# Patient Record
Sex: Female | Born: 1965
Health system: Southern US, Community
[De-identification: ages and names within clinical notes are randomized; demographics above are authoritative.]

## PROBLEM LIST (undated history)

## (undated) DIAGNOSIS — E785 Hyperlipidemia, unspecified: Secondary | ICD-10-CM

## (undated) DIAGNOSIS — F319 Bipolar disorder, unspecified: Secondary | ICD-10-CM

## (undated) DIAGNOSIS — E119 Type 2 diabetes mellitus without complications: Secondary | ICD-10-CM

## (undated) DIAGNOSIS — I10 Essential (primary) hypertension: Secondary | ICD-10-CM

## (undated) HISTORY — PX: CHOLECYSTECTOMY: SHX55

## (undated) HISTORY — PX: APPENDECTOMY: SHX54

---

## 2013-08-23 ENCOUNTER — Encounter (HOSPITAL_BASED_OUTPATIENT_CLINIC_OR_DEPARTMENT_OTHER): Payer: Self-pay | Admitting: Emergency Medicine

## 2013-08-23 ENCOUNTER — Emergency Department (HOSPITAL_BASED_OUTPATIENT_CLINIC_OR_DEPARTMENT_OTHER): Payer: Medicaid Other

## 2013-08-23 ENCOUNTER — Emergency Department (HOSPITAL_BASED_OUTPATIENT_CLINIC_OR_DEPARTMENT_OTHER)
Admission: EM | Admit: 2013-08-23 | Discharge: 2013-08-23 | Disposition: A | Discharge status: Home / Self Care | Payer: Medicaid Other | Attending: Emergency Medicine | Admitting: Emergency Medicine

## 2013-08-23 DIAGNOSIS — F319 Bipolar disorder, unspecified: Secondary | ICD-10-CM | POA: Insufficient documentation

## 2013-08-23 DIAGNOSIS — Y929 Unspecified place or not applicable: Secondary | ICD-10-CM | POA: Insufficient documentation

## 2013-08-23 DIAGNOSIS — Y9389 Activity, other specified: Secondary | ICD-10-CM | POA: Insufficient documentation

## 2013-08-23 DIAGNOSIS — S5001XA Contusion of right elbow, initial encounter: Secondary | ICD-10-CM

## 2013-08-23 DIAGNOSIS — Z794 Long term (current) use of insulin: Secondary | ICD-10-CM | POA: Insufficient documentation

## 2013-08-23 DIAGNOSIS — S5000XA Contusion of unspecified elbow, initial encounter: Secondary | ICD-10-CM | POA: Insufficient documentation

## 2013-08-23 DIAGNOSIS — I10 Essential (primary) hypertension: Secondary | ICD-10-CM | POA: Insufficient documentation

## 2013-08-23 DIAGNOSIS — W2209XA Striking against other stationary object, initial encounter: Secondary | ICD-10-CM | POA: Insufficient documentation

## 2013-08-23 DIAGNOSIS — Z79899 Other long term (current) drug therapy: Secondary | ICD-10-CM | POA: Insufficient documentation

## 2013-08-23 DIAGNOSIS — E119 Type 2 diabetes mellitus without complications: Secondary | ICD-10-CM | POA: Insufficient documentation

## 2013-08-23 HISTORY — DX: Essential (primary) hypertension: I10

## 2013-08-23 HISTORY — DX: Type 2 diabetes mellitus without complications: E11.9

## 2013-08-23 HISTORY — DX: Bipolar disorder, unspecified: F31.9

## 2013-08-23 MED ORDER — IBUPROFEN 800 MG PO TABS: 800.0000 mg | ORAL_TABLET | Freq: Three times a day (TID) | ORAL | Status: DC

## 2013-08-23 NOTE — ED Provider Notes (Signed)
CSN: [id number]     Arrival date & time 08/23/13  1421 History   First MD Initiated Contact with Patient 08/23/13 1539     Chief Complaint  Patient presents with  . Arm Pain   (Consider location/radiation/quality/duration/timing/severity/associated sxs/prior Treatment) Patient is a 48 y.o. female presenting with arm pain. The history is provided by the patient.  Arm Pain This is a new problem. The current episode started yesterday. The problem occurs constantly. The problem has been unchanged. She has tried acetaminophen and oral narcotics for the symptoms. The treatment provided moderate relief.   Megan Werner is a 48 y.o. female who presents to the ED with right elbow pain that stated yesterday after she hit her elbow on the bed frame. She applied ice and took an oxycodone she had from a previous visit. She denies any other injuries. The pain radiates from the elbow to the upper arm.  Past Medical History  Diagnosis Date  . Diabetes mellitus without complication   . Hypertension   . Bipolar 1 disorder    History reviewed. No pertinent past surgical history. No family history on file. History  Substance Use Topics  . Smoking status: Never Smoker   . Smokeless tobacco: Not on file  . Alcohol Use: Not on file   OB History   Grav Para Term Preterm Abortions TAB SAB Ect Mult Living                 Review of Systems Negative except as stated in HPI  Allergies  Review of patient's allergies indicates no known allergies.  Home Medications   Current Outpatient Rx  Name  Route  Sig  Dispense  Refill  . carvedilol (COREG) 12.5 MG tablet   Oral   Take 12.5 mg by mouth 2 (two) times daily with a meal.         . insulin glargine (LANTUS) 100 UNIT/ML injection   Subcutaneous   Inject 35 Units into the skin 2 (two) times daily.         Marland Kitchen losartan (COZAAR) 100 MG tablet   Oral   Take 100 mg by mouth daily.         . metFORMIN (GLUCOPHAGE) 1000 MG tablet   Oral   Take  1,000 mg by mouth 2 (two) times daily with a meal.         . oxyCODONE-acetaminophen (PERCOCET/ROXICET) 5-325 MG per tablet   Oral   Take by mouth every 4 (four) hours as needed for severe pain.         . traZODone (DESYREL) 100 MG tablet   Oral   Take 100 mg by mouth at bedtime.         . Vilazodone HCl (VIIBRYD) 10 MG TABS   Oral   Take by mouth daily.          BP 118/83  Pulse 73  Temp(Src) 97.7 F (36.5 C) (Oral)  Resp 18  Ht 5\' 2"  (1.575 m)  Wt 260 lb (117.935 kg)  BMI 47.54 kg/m2  SpO2 95% Physical Exam  Nursing note and vitals reviewed. Constitutional: She is oriented to person, place, and time. She appears well-developed and well-nourished.  HENT:  Head: Normocephalic.  Eyes: EOM are normal.  Neck: Neck supple.  Cardiovascular: Normal rate.   Pulmonary/Chest: Effort normal.  Musculoskeletal: Normal range of motion.       Right elbow: She exhibits normal range of motion, no swelling, no deformity and no laceration. Tenderness found.  Radial head tenderness noted.       Arms: The pain radiates to the upper and lower arm. Radial pulse strong and equal. Adequate circulation, good touch sensation. Full range of motion without difficulty. Pain with palpation over elbow.  Neurological: She is alert and oriented to person, place, and time. No cranial nerve deficit.  Skin: Skin is warm and dry.  Psychiatric: She has a normal mood and affect. Her behavior is normal.    ED Course  Procedures (including critical care time) Labs Review Labs Reviewed - No data to display Imaging Review Dg Elbow Complete Right  08/23/2013   CLINICAL DATA:  Pain post injury yesterday  EXAM: RIGHT ELBOW - COMPLETE 3+ VIEW  COMPARISON:  None.  FINDINGS: Five views of the right elbow submitted. No acute fracture or subluxation. No radiopaque foreign body. No posterior fat pad sign.  IMPRESSION: Negative.   Electronically Signed   By: Natasha MeadLiviu  Pop M.D.   On: 08/23/2013 15:17   Dg Forearm  Right  08/23/2013   CLINICAL DATA:  Pain post injury yesterday  EXAM: RIGHT FOREARM - 2 VIEW  COMPARISON:  None.  FINDINGS: There is no evidence of fracture or other focal bone lesions. Soft tissues are unremarkable.  IMPRESSION: Negative.   Electronically Signed   By: Natasha MeadLiviu  Pop M.D.   On: 08/23/2013 15:18     MDM  48 y.o. female with contusion to the right elbow. I have reviewed this patient's vital signs, nurses notes, appropriate labs and imaging.  I have discussed findings and plan of care with the patient and she voices understanding.  Will give ibuprofen for pain, she will apply ice and elevate and follow up with her PCP. She will return here as needed.     Janne NapoleonHope M Neese, [place]NP 08/24/13 [phone]

## 2013-08-23 NOTE — Discharge Instructions (Signed)
Elbow Contusion °An elbow contusion is a deep bruise of the elbow. Contusions are the result of an injury that caused bleeding under the skin. The contusion may turn blue, purple, or yellow. Minor injuries will give you a painless contusion, but more severe contusions may stay painful and swollen for a few weeks.  °CAUSES  °An elbow contusion comes from a direct force to that area, such as falling on the elbow. °SYMPTOMS  °· Swelling and redness of the elbow. °· Bruising of the elbow area. °· Tenderness or soreness of the elbow. °DIAGNOSIS  °You will have a physical exam and will be asked about your history. You may need an X-ray of your elbow to look for a broken bone (fracture).  °TREATMENT  °A sling or splint may be needed to support your injury. Resting, elevating, and applying cold compresses to the elbow area are often the best treatments for an elbow contusion. Over-the-counter medicines may also be recommended for pain control. °HOME CARE INSTRUCTIONS  °· Put ice on the injured area. °· Put ice in a plastic bag. °· Place a towel between your skin and the bag. °· Leave the ice on for 15-20 minutes, 03-04 times a day. °· Only take over-the-counter or prescription medicines for pain, discomfort, or fever as directed by your caregiver. °· Rest your injured elbow until the pain and swelling are better. °· Elevate your elbow to reduce swelling. °· Apply a compression wrap as directed by your caregiver. This can help reduce swelling and motion. You may remove the wrap for sleeping, showers, and baths. If your fingers become numb, cold, or blue, take the wrap off and reapply it more loosely. °· Use your elbow only as directed by your caregiver. You may be asked to do range of motion exercises. Do them as directed. °· See your caregiver as directed. It is very important to keep all follow-up appointments in order to avoid any long-term problems with your elbow, including chronic pain or inability to move your elbow  normally. °SEEK IMMEDIATE MEDICAL CARE IF:  °· You have increased redness, swelling, or pain in your elbow. °· Your swelling or pain is not relieved with medicines. °· You have swelling of the hand and fingers. °· You are unable to move your fingers or wrist. °· You begin to lose feeling in your hand or fingers. °· Your fingers or hand become cold or blue. °MAKE SURE YOU:  °· Understand these instructions. °· Will watch your condition. °· Will get help right away if you are not doing well or get worse. °Document Released: 07/08/2006 Document Revised: 10/22/2011 Document Reviewed: 06/15/2011 °[employer]® Patient Information ©2014 [employer]. ° °

## 2013-08-23 NOTE — ED Notes (Signed)
Patient here with right arm pain x 1 day after hitting elbow on bed frame yesterday. Denies fall or any other injury

## 2013-08-24 NOTE — ED Provider Notes (Signed)
Medical screening examination/treatment/procedure(s) were performed by non-physician practitioner and as supervising physician I was immediately available for consultation/collaboration.  EKG Interpretation   None        Annais Crafts M Tatisha Cerino, MD 08/24/13 2112 

## 2014-02-25 ENCOUNTER — Encounter: Payer: Self-pay | Admitting: Gastroenterology

## 2014-06-04 ENCOUNTER — Encounter (HOSPITAL_BASED_OUTPATIENT_CLINIC_OR_DEPARTMENT_OTHER): Payer: Self-pay | Admitting: Emergency Medicine

## 2014-06-04 ENCOUNTER — Emergency Department (HOSPITAL_BASED_OUTPATIENT_CLINIC_OR_DEPARTMENT_OTHER): Payer: Medicaid Other

## 2014-06-04 ENCOUNTER — Emergency Department (HOSPITAL_BASED_OUTPATIENT_CLINIC_OR_DEPARTMENT_OTHER)
Admission: EM | Admit: 2014-06-04 | Discharge: 2014-06-04 | Disposition: A | Discharge status: Home / Self Care | Payer: Medicaid Other | Attending: Emergency Medicine | Admitting: Emergency Medicine

## 2014-06-04 DIAGNOSIS — Z79899 Other long term (current) drug therapy: Secondary | ICD-10-CM | POA: Diagnosis not present

## 2014-06-04 DIAGNOSIS — M25561 Pain in right knee: Secondary | ICD-10-CM | POA: Insufficient documentation

## 2014-06-04 DIAGNOSIS — M79606 Pain in leg, unspecified: Secondary | ICD-10-CM

## 2014-06-04 DIAGNOSIS — G8929 Other chronic pain: Secondary | ICD-10-CM

## 2014-06-04 DIAGNOSIS — F319 Bipolar disorder, unspecified: Secondary | ICD-10-CM | POA: Diagnosis not present

## 2014-06-04 DIAGNOSIS — M79605 Pain in left leg: Secondary | ICD-10-CM | POA: Diagnosis present

## 2014-06-04 DIAGNOSIS — E669 Obesity, unspecified: Secondary | ICD-10-CM | POA: Diagnosis not present

## 2014-06-04 DIAGNOSIS — G629 Polyneuropathy, unspecified: Secondary | ICD-10-CM | POA: Insufficient documentation

## 2014-06-04 DIAGNOSIS — Z794 Long term (current) use of insulin: Secondary | ICD-10-CM | POA: Diagnosis not present

## 2014-06-04 DIAGNOSIS — M25562 Pain in left knee: Secondary | ICD-10-CM | POA: Diagnosis not present

## 2014-06-04 DIAGNOSIS — I10 Essential (primary) hypertension: Secondary | ICD-10-CM | POA: Insufficient documentation

## 2014-06-04 LAB — URINALYSIS, ROUTINE W REFLEX MICROSCOPIC
Bilirubin Urine: NEGATIVE
GLUCOSE, UA: NEGATIVE mg/dL
Hgb urine dipstick: NEGATIVE
Ketones, ur: NEGATIVE mg/dL
LEUKOCYTES UA: NEGATIVE
Nitrite: NEGATIVE
Protein, ur: NEGATIVE mg/dL
SPECIFIC GRAVITY, URINE: 1.003 — AB (ref 1.005–1.030)
UROBILINOGEN UA: 0.2 mg/dL (ref 0.0–1.0)
pH: 5.5 (ref 5.0–8.0)

## 2014-06-04 LAB — CBG MONITORING, ED: GLUCOSE-CAPILLARY: 99 mg/dL (ref 70–99)

## 2014-06-04 MED ORDER — HYDROCODONE-ACETAMINOPHEN 5-325 MG PO TABS
1.0000 | ORAL_TABLET | ORAL | Status: AC
  Administered 2014-06-04: 1 via ORAL
  Filled 2014-06-04: qty 1

## 2014-06-04 MED ORDER — HYDROCODONE-ACETAMINOPHEN 5-325 MG PO TABS: ORAL_TABLET | ORAL | Status: DC

## 2014-06-04 NOTE — ED Provider Notes (Signed)
CSN: [id number]     Arrival date & time 06/04/14  1130 History   First MD Initiated Contact with Patient 06/04/14 1231     Chief Complaint  Patient presents with  . Leg Pain     (Consider location/radiation/quality/duration/timing/severity/associated sxs/prior Treatment) HPI  Megan Werner is a 48 y.o. female accompanied by her mother, lives independently with past medical history significant for insulin-dependent diabetes, hypertension bipolar disorder complaining of one month of bilateral foot numbness which she describes as a pins and needles paresthesia in addition to bilateral knee pain. Patient denies falls, difficulty ambulating, states that she has lost sleep over the last night because her foot pain is so severe. She has been on a trial of gabapentin without success. She said to the ED by her primary care physician for evaluation of DVT. Patient denies any history of DVT or PE, any trauma, recent immobilization, exogenous estrogen, chest pain, shortness of breath, fever, cough, nausea vomiting, change in bowel or bladder habits, abdominal pain.  Take that she has not checked her blood sugar in 4 months because she broke her glucometer. States that she has Medicaid and cannot get it refilled. She is on a long-acting insulin once per day and metformin. Patient should be checking blood sugars 3 times per day.  Past Medical History  Diagnosis Date  . Diabetes mellitus without complication   . Hypertension   . Bipolar 1 disorder    Past Surgical History  Procedure Laterality Date  . Appendectomy    . Cholecystectomy     No family history on file. History  Substance Use Topics  . Smoking status: Never Smoker   . Smokeless tobacco: Not on file  . Alcohol Use: No   OB History   Grav Para Term Preterm Abortions TAB SAB Ect Mult Living                 Review of Systems  10 systems reviewed and found to be negative, except as noted in the HPI.   Allergies  Review of  patient's allergies indicates no known allergies.  Home Medications   Prior to Admission medications   Medication Sig Start Date End Date Taking? Authorizing Provider  Linaclotide (LINZESS PO) Take by mouth.   Yes Historical Provider, MD  Paliperidone (INVEGA PO) Take by mouth.   Yes Historical Provider, MD  carvedilol (COREG) 12.5 MG tablet Take 12.5 mg by mouth 2 (two) times daily with a meal.    Historical Provider, MD  HYDROcodone-acetaminophen (NORCO/VICODIN) 5-325 MG per tablet Take 1-2 tablets by mouth every 6 hours as needed for pain. 06/04/14   Jadda Hunsucker, PA-C  ibuprofen (ADVIL,MOTRIN) 800 MG tablet Take 1 tablet (800 mg total) by mouth 3 (three) times daily. 08/23/13   Hope Orlene Och, NP  insulin glargine (LANTUS) 100 UNIT/ML injection Inject 35 Units into the skin 2 (two) times daily.    Historical Provider, MD  losartan (COZAAR) 100 MG tablet Take 100 mg by mouth daily.    Historical Provider, MD  metFORMIN (GLUCOPHAGE) 1000 MG tablet Take 1,000 mg by mouth 2 (two) times daily with a meal.    Historical Provider, MD  oxyCODONE-acetaminophen (PERCOCET/ROXICET) 5-325 MG per tablet Take by mouth every 4 (four) hours as needed for severe pain.    Historical Provider, MD  traZODone (DESYREL) 100 MG tablet Take 100 mg by mouth at bedtime.    Historical Provider, MD  Vilazodone HCl (VIIBRYD) 10 MG TABS Take by mouth daily.  Historical Provider, MD   BP 124/78  Pulse 77  Temp(Src) 98.6 F (37 C) (Oral)  Resp 18  Ht 5\' 2"  (1.575 m)  Wt 246 lb (111.585 kg)  BMI 44.98 kg/m2  SpO2 97% Physical Exam  Constitutional: She is oriented to person, place, and time. She appears well-developed and well-nourished.  Obese  HENT:  Werner: Normocephalic.  Mouth/Throat: Oropharynx is clear and moist.  Eyes: Pupils are equal, round, and reactive to light.  Neck: Normal range of motion. Neck supple.  Cardiovascular: Normal rate.   Pulmonary/Chest: Effort normal and breath sounds normal.   Abdominal: Soft. Bowel sounds are normal. She exhibits no distension and no mass. There is no tenderness. There is no rebound and no guarding.  Musculoskeletal: She exhibits no edema and no tenderness.  Bilateral knees:  Positive crepitance, full range of motion, stable to valgus and varus stress, stable to anterior and posterior drawer. Distally neurovascularly intact.  No calf asymmetry, superficial collaterals, palpable cords, edema, Homans sign negative bilaterally.    Neurological: She is alert and oriented to person, place, and time.  DTRs 2+ bilaterally, patient reports decreased sensation to bilateral feet. She is able to distinguish light touch from pinprick on the left left foot medial side. Otherwise  neurovascularly intact  Psychiatric:  Flat affect    ED Course  Procedures (including critical care time) Labs Review Labs Reviewed  URINALYSIS, ROUTINE W REFLEX MICROSCOPIC - Abnormal; Notable for the following:    Specific Gravity, Urine 1.003 (*)    All other components within normal limits  CBG MONITORING, ED    Imaging Review [place] Venous Img Lower Bilateral  06/04/2014   CLINICAL DATA:  bilateral lower extremity pain, diabetes  EXAM: BILATERAL LOWER EXTREMITY VENOUS DOPPLER ULTRASOUND  TECHNIQUE: Gray-scale sonography with graded compression, as well as color Doppler and duplex ultrasound were performed to evaluate the lower extremity deep venous systems from the level of the common femoral vein and including the common femoral, femoral, profunda femoral, popliteal and calf veins including the posterior tibial, peroneal and gastrocnemius veins when visible. The superficial great saphenous vein was also interrogated. Spectral Doppler was utilized to evaluate flow at rest and with distal augmentation maneuvers in the common femoral, femoral and popliteal veins.  COMPARISON:  None.  FINDINGS: RIGHT LOWER EXTREMITY  Common Femoral Vein: No evidence of thrombus. Normal  compressibility, respiratory phasicity and response to augmentation.  Saphenofemoral Junction: No evidence of thrombus. Normal compressibility and flow on color Doppler imaging.  Profunda Femoral Vein: No evidence of thrombus. Normal compressibility and flow on color Doppler imaging.  Femoral Vein: No evidence of thrombus. Normal compressibility, respiratory phasicity and response to augmentation.  Popliteal Vein: No evidence of thrombus. Normal compressibility, respiratory phasicity and response to augmentation.  Calf Veins: No evidence of thrombus. Normal compressibility and flow on color Doppler imaging.  Superficial Great Saphenous Vein: No evidence of thrombus. Normal compressibility and flow on color Doppler imaging.  Venous Reflux:  None.  Other Findings:  None.  LEFT LOWER EXTREMITY  Common Femoral Vein: No evidence of thrombus. Normal compressibility, respiratory phasicity and response to augmentation.  Saphenofemoral Junction: No evidence of thrombus. Normal compressibility and flow on color Doppler imaging.  Profunda Femoral Vein: No evidence of thrombus. Normal compressibility and flow on color Doppler imaging.  Femoral Vein: No evidence of thrombus. Normal compressibility, respiratory phasicity and response to augmentation.  Popliteal Vein: No evidence of thrombus. Normal compressibility, respiratory phasicity and response to augmentation.  Calf Veins:  No evidence of thrombus. Normal compressibility and flow on color Doppler imaging.  Superficial Great Saphenous Vein: No evidence of thrombus. Normal compressibility and flow on color Doppler imaging.  Venous Reflux:  None.  Other Findings:  None.  IMPRESSION: No evidence of deep venous thrombosis.   Electronically Signed   By: Ruel Favorsrevor  Shick M.D.   On: 06/04/2014 14:04     EKG Interpretation None      MDM   Final diagnoses:  Leg pain  Neuropathy  Bilateral chronic knee pain    Filed Vitals:   06/04/14 1147 06/04/14 1402  BP: 116/75  124/78  Pulse: 81 77  Temp: 98.6 F (37 C)   TempSrc: Oral   Resp: 20 18  Height: 5\' 2"  (1.575 m)   Weight: 246 lb (111.585 kg)   SpO2: 95% 97%    Medications  HYDROcodone-acetaminophen (NORCO/VICODIN) 5-325 MG per tablet 1 tablet (1 tablet Oral Given 06/04/14 1438)    Karlyn AgeeRenee Withington is a 48 y.o. female presenting with bilateral knee pain and decreased sensation to bilateral feet. No trauma, no indication for imaging of knees. There is crepitance, she is overweight, this is likely arthritis. Physical exam is not consistent with DVT however tests are ordered as a courtesy patient's primary care physician. Doppler studies are negative. Patient shows decreased ability to distinguish light touch from pinprick on the left foot. I think this may be related to a diabetic neuropathy. I stressed to the patient and her mother the importance of checking blood sugar often. I've advised her to follow with primary care for evaluation for diabetic neuropathy. Blood sugar is normal in the ED.  Evaluation does not show pathology that would require ongoing emergent intervention or inpatient treatment. Pt is hemodynamically stable and mentating appropriately. Discussed findings and plan with patient/guardian, who agrees with care plan. All questions answered. Return precautions discussed and outpatient follow up given.   New Prescriptions   HYDROCODONE-ACETAMINOPHEN (NORCO/VICODIN) 5-325 MG PER TABLET    Take 1-2 tablets by mouth every 6 hours as needed for pain.         Wynetta Emeryicole Karrine Kluttz, PA-C 06/04/14 1450

## 2014-06-04 NOTE — ED Notes (Signed)
Pain in both legs for a month. Her doctor wanted her to come here for doppler studies.

## 2014-06-04 NOTE — Discharge Instructions (Signed)
Is very important that your check your blood sugars frequently please obtain a glucose monitor as soon as possible.  It is likely that you have a diabetic neuropathy is very important to keep your blood sugars in close control to prevent further deterioration of numbness.  Please follow with your primary care physician in the next 2 days for a checkup.

## 2014-06-05 NOTE — ED Provider Notes (Signed)
Medical screening examination/treatment/procedure(s) were performed by non-physician practitioner and as supervising physician I was immediately available for consultation/collaboration.   EKG Interpretation None        Vanetta MuldersScott Kensly Bowmer, MD 06/05/14 1523

## 2014-11-27 ENCOUNTER — Emergency Department (HOSPITAL_BASED_OUTPATIENT_CLINIC_OR_DEPARTMENT_OTHER)
Admission: EM | Admit: 2014-11-27 | Discharge: 2014-11-27 | Disposition: A | Discharge status: Home / Self Care | Payer: Medicaid Other | Attending: Emergency Medicine | Admitting: Emergency Medicine

## 2014-11-27 ENCOUNTER — Encounter (HOSPITAL_BASED_OUTPATIENT_CLINIC_OR_DEPARTMENT_OTHER): Payer: Self-pay | Admitting: *Deleted

## 2014-11-27 ENCOUNTER — Emergency Department (HOSPITAL_BASED_OUTPATIENT_CLINIC_OR_DEPARTMENT_OTHER): Payer: Medicaid Other

## 2014-11-27 DIAGNOSIS — W1839XA Other fall on same level, initial encounter: Secondary | ICD-10-CM | POA: Diagnosis not present

## 2014-11-27 DIAGNOSIS — Y998 Other external cause status: Secondary | ICD-10-CM | POA: Insufficient documentation

## 2014-11-27 DIAGNOSIS — Z791 Long term (current) use of non-steroidal anti-inflammatories (NSAID): Secondary | ICD-10-CM | POA: Diagnosis not present

## 2014-11-27 DIAGNOSIS — Y9389 Activity, other specified: Secondary | ICD-10-CM | POA: Diagnosis not present

## 2014-11-27 DIAGNOSIS — M79601 Pain in right arm: Secondary | ICD-10-CM

## 2014-11-27 DIAGNOSIS — Y9289 Other specified places as the place of occurrence of the external cause: Secondary | ICD-10-CM | POA: Diagnosis not present

## 2014-11-27 DIAGNOSIS — F319 Bipolar disorder, unspecified: Secondary | ICD-10-CM | POA: Diagnosis not present

## 2014-11-27 DIAGNOSIS — I10 Essential (primary) hypertension: Secondary | ICD-10-CM | POA: Insufficient documentation

## 2014-11-27 DIAGNOSIS — E119 Type 2 diabetes mellitus without complications: Secondary | ICD-10-CM | POA: Diagnosis not present

## 2014-11-27 DIAGNOSIS — S59911A Unspecified injury of right forearm, initial encounter: Secondary | ICD-10-CM | POA: Insufficient documentation

## 2014-11-27 DIAGNOSIS — W19XXXA Unspecified fall, initial encounter: Secondary | ICD-10-CM

## 2014-11-27 DIAGNOSIS — Z794 Long term (current) use of insulin: Secondary | ICD-10-CM | POA: Diagnosis not present

## 2014-11-27 DIAGNOSIS — Z79899 Other long term (current) drug therapy: Secondary | ICD-10-CM | POA: Insufficient documentation

## 2014-11-27 NOTE — Discharge Instructions (Signed)
Rest, ice and elevate your injury. Refer to attached documents for more information.

## 2014-11-27 NOTE — ED Provider Notes (Signed)
CSN: [id number]     Arrival date & time 11/27/14  1141 History   First MD Initiated Contact with Patient 11/27/14 1204     Chief Complaint  Patient presents with  . Arm Pain     (Consider location/radiation/quality/duration/timing/severity/associated sxs/prior Treatment) HPI Comments: Patient complains of right elbow pain that started immediately after the fall. No radiation. The pain is aching and severe. No other injury.   Patient is a 49 y.o. female presenting with fall. The history is provided by the patient. No language interpreter was used.  Fall This is a new problem. The current episode started in the past 7 days. The problem occurs constantly. The problem has been unchanged. Associated symptoms include arthralgias. Pertinent negatives include no abdominal pain, chest pain, chills, fatigue, fever, nausea, neck pain, vomiting or weakness. Exacerbated by: movement. She has tried acetaminophen for the symptoms. The treatment provided no relief.    Past Medical History  Diagnosis Date  . Diabetes mellitus without complication   . Hypertension   . Bipolar 1 disorder    Past Surgical History  Procedure Laterality Date  . Appendectomy    . Cholecystectomy     No family history on file. History  Substance Use Topics  . Smoking status: Never Smoker   . Smokeless tobacco: Never Used  . Alcohol Use: No   OB History    No data available     Review of Systems  Constitutional: Negative for fever, chills and fatigue.  HENT: Negative for trouble swallowing.   Eyes: Negative for visual disturbance.  Respiratory: Negative for shortness of breath.   Cardiovascular: Negative for chest pain and palpitations.  Gastrointestinal: Negative for nausea, vomiting, abdominal pain and diarrhea.  Genitourinary: Negative for dysuria and difficulty urinating.  Musculoskeletal: Positive for arthralgias. Negative for neck pain.  Skin: Negative for color change.  Neurological: Negative for  dizziness and weakness.  Psychiatric/Behavioral: Negative for dysphoric mood.      Allergies  Codeine and Ibuprofen  Home Medications   Prior to Admission medications   Medication Sig Start Date End Date Taking? Authorizing Provider  insulin glargine (LANTUS) 100 UNIT/ML injection Inject 35 Units into the skin 2 (two) times daily.   Yes Historical Provider, MD  Linaclotide (LINZESS PO) Take by mouth.   Yes Historical Provider, MD  metFORMIN (GLUCOPHAGE) 1000 MG tablet Take 1,000 mg by mouth 2 (two) times daily with a meal.   Yes Historical Provider, MD  Paliperidone (INVEGA PO) Take by mouth.   Yes Historical Provider, MD  traZODone (DESYREL) 100 MG tablet Take 100 mg by mouth at bedtime.   Yes Historical Provider, MD  Vilazodone HCl (VIIBRYD) 10 MG TABS Take by mouth daily.   Yes Historical Provider, MD  carvedilol (COREG) 12.5 MG tablet Take 12.5 mg by mouth 2 (two) times daily with a meal.    Historical Provider, MD  HYDROcodone-acetaminophen (NORCO/VICODIN) 5-325 MG per tablet Take 1-2 tablets by mouth every 6 hours as needed for pain. 06/04/14   Nicole Pisciotta, PA-C  ibuprofen (ADVIL,MOTRIN) 800 MG tablet Take 1 tablet (800 mg total) by mouth 3 (three) times daily. 08/23/13   Hope Orlene OchM Neese, NP  losartan (COZAAR) 100 MG tablet Take 100 mg by mouth daily.    Historical Provider, MD  oxyCODONE-acetaminophen (PERCOCET/ROXICET) 5-325 MG per tablet Take by mouth every 4 (four) hours as needed for severe pain.    Historical Provider, MD   BP 109/61 mmHg  Pulse 90  Temp(Src) 98 F (36.7  C) (Oral)  Resp 18  Wt 214 lb (97.07 kg)  SpO2 96% Physical Exam  Constitutional: She is oriented to person, place, and time. She appears well-developed and well-nourished. No distress.  HENT:  Head: Normocephalic and atraumatic.  Eyes: Conjunctivae and EOM are normal.  Neck: Normal range of motion.  Cardiovascular: Normal rate and regular rhythm.  Exam reveals no gallop and no friction rub.   No  murmur heard. Pulmonary/Chest: Effort normal and breath sounds normal. She has no wheezes. She has no rales. She exhibits no tenderness.  Abdominal: Soft. She exhibits no distension. There is no tenderness. There is no rebound.  Musculoskeletal: Normal range of motion.  Full ROM of bilateral elbows. No obvious deformity. Right elbow pain with extreme pronation of the right arm.   No midline spine tenderness to palpation.   Neurological: She is alert and oriented to person, place, and time. Coordination normal.  Speech is goal-oriented. Moves limbs without ataxia.   Skin: Skin is warm and dry.  Psychiatric: She has a normal mood and affect. Her behavior is normal.  Nursing note and vitals reviewed.   ED Course  Procedures (including critical care time) Labs Review Labs Reviewed - No data to display  Imaging Review Dg Elbow Complete Right  11/27/2014   CLINICAL DATA:  49 year old female with right upper extremity pain which persists after a fall 3 months previously.  EXAM: RIGHT ELBOW - COMPLETE 3+ VIEW  COMPARISON:  None.  FINDINGS: There is no evidence of fracture, dislocation, or joint effusion. There is no evidence of arthropathy or other focal bone abnormality. Soft tissues are unremarkable.  IMPRESSION: Negative.   Electronically Signed   By: Malachy Moan M.D.   On: 11/27/2014 12:34     EKG Interpretation None      MDM   Final diagnoses:  Right arm pain  Fall, initial encounter    1:07 PM Patient's xray unremarkable for acute changes. Patient advised to apply ice to injuries. Patient refuses pain medication. Vitals stable and patient afebrile.     Emilia Beck, PA-C 11/27/14 1313  Toy Cookey, MD 11/28/14 [phone]

## 2014-11-27 NOTE — ED Notes (Signed)
Pt reports she fell one week ago- c/o pain in right arm- states she fractured her tailbone months ago and it is hurting also

## 2015-10-03 ENCOUNTER — Emergency Department (HOSPITAL_BASED_OUTPATIENT_CLINIC_OR_DEPARTMENT_OTHER)
Admission: EM | Admit: 2015-10-03 | Discharge: 2015-10-03 | Disposition: A | Discharge status: Home / Self Care | Payer: Medicaid Other | Attending: Emergency Medicine | Admitting: Emergency Medicine

## 2015-10-03 ENCOUNTER — Emergency Department (HOSPITAL_BASED_OUTPATIENT_CLINIC_OR_DEPARTMENT_OTHER): Payer: Medicaid Other

## 2015-10-03 ENCOUNTER — Encounter (HOSPITAL_BASED_OUTPATIENT_CLINIC_OR_DEPARTMENT_OTHER): Payer: Self-pay

## 2015-10-03 DIAGNOSIS — R109 Unspecified abdominal pain: Secondary | ICD-10-CM | POA: Diagnosis present

## 2015-10-03 DIAGNOSIS — Z794 Long term (current) use of insulin: Secondary | ICD-10-CM | POA: Diagnosis not present

## 2015-10-03 DIAGNOSIS — E119 Type 2 diabetes mellitus without complications: Secondary | ICD-10-CM | POA: Insufficient documentation

## 2015-10-03 DIAGNOSIS — I10 Essential (primary) hypertension: Secondary | ICD-10-CM | POA: Diagnosis not present

## 2015-10-03 DIAGNOSIS — F319 Bipolar disorder, unspecified: Secondary | ICD-10-CM | POA: Insufficient documentation

## 2015-10-03 DIAGNOSIS — N200 Calculus of kidney: Secondary | ICD-10-CM | POA: Insufficient documentation

## 2015-10-03 DIAGNOSIS — Z3202 Encounter for pregnancy test, result negative: Secondary | ICD-10-CM | POA: Diagnosis not present

## 2015-10-03 DIAGNOSIS — Z7984 Long term (current) use of oral hypoglycemic drugs: Secondary | ICD-10-CM | POA: Insufficient documentation

## 2015-10-03 LAB — CBC WITH DIFFERENTIAL/PLATELET
Basophils Absolute: 0 10*3/uL (ref 0.0–0.1)
Basophils Relative: 0 %
Eosinophils Absolute: 0.1 10*3/uL (ref 0.0–0.7)
Eosinophils Relative: 1 %
HCT: 40.2 % (ref 36.0–46.0)
Hemoglobin: 13.4 g/dL (ref 12.0–15.0)
Lymphocytes Relative: 38 %
Lymphs Abs: 2.5 10*3/uL (ref 0.7–4.0)
MCH: 30.6 pg (ref 26.0–34.0)
MCHC: 33.3 g/dL (ref 30.0–36.0)
MCV: 91.8 fL (ref 78.0–100.0)
Monocytes Absolute: 0.9 10*3/uL (ref 0.1–1.0)
Monocytes Relative: 13 %
Neutro Abs: 3.2 10*3/uL (ref 1.7–7.7)
Neutrophils Relative %: 48 %
Platelets: 224 10*3/uL (ref 150–400)
RBC: 4.38 MIL/uL (ref 3.87–5.11)
RDW: 11.8 % (ref 11.5–15.5)
WBC: 6.6 10*3/uL (ref 4.0–10.5)

## 2015-10-03 LAB — BASIC METABOLIC PANEL
Anion gap: 9 (ref 5–15)
BUN: 10 mg/dL (ref 6–20)
CO2: 24 mmol/L (ref 22–32)
Calcium: 9.2 mg/dL (ref 8.9–10.3)
Chloride: 103 mmol/L (ref 101–111)
Creatinine, Ser: 0.62 mg/dL (ref 0.44–1.00)
GFR calc Af Amer: >60 mL/min (ref >60)
GFR calc non Af Amer: >60 mL/min (ref >60)
Glucose, Bld: 81 mg/dL (ref 65–99)
Potassium: 5.3 mmol/L — ABNORMAL HIGH (ref 3.5–5.1)
Sodium: 136 mmol/L (ref 135–145)

## 2015-10-03 LAB — URINALYSIS, ROUTINE W REFLEX MICROSCOPIC
BILIRUBIN URINE: NEGATIVE
Glucose, UA: NEGATIVE mg/dL
Hgb urine dipstick: NEGATIVE
Ketones, ur: NEGATIVE mg/dL
Leukocytes, UA: NEGATIVE
Nitrite: NEGATIVE
Protein, ur: NEGATIVE mg/dL
Specific Gravity, Urine: 1.002 — ABNORMAL LOW (ref 1.005–1.030)
pH: 6 (ref 5.0–8.0)

## 2015-10-03 LAB — PREGNANCY, URINE: PREG TEST UR: NEGATIVE

## 2015-10-03 MED ORDER — TRAMADOL HCL 50 MG PO TABS: 50.0000 mg | ORAL_TABLET | Freq: Four times a day (QID) | ORAL | Status: DC | PRN

## 2015-10-03 MED ORDER — ACETAMINOPHEN 325 MG PO TABS
650.0000 mg | ORAL_TABLET | Freq: Once | ORAL | Status: AC
  Administered 2015-10-03: 650 mg via ORAL
  Filled 2015-10-03: qty 2

## 2015-10-03 MED FILL — traMADol HCL 50 MG TABS: 50 | 4 days supply | Qty: 15 | Fill #0

## 2015-10-03 NOTE — ED Notes (Signed)
Patient transported to radiology department. 

## 2015-10-03 NOTE — ED Notes (Signed)
C/o right flank, abd pain x 2 days

## 2015-10-03 NOTE — Discharge Instructions (Signed)
Dietary Guidelines to Help Prevent Kidney Stones °Your risk of kidney stones can be decreased by adjusting the foods you eat. The most important thing you can do is drink enough fluid. You should drink enough fluid to keep your urine clear or pale yellow. The following guidelines provide specific information for the type of kidney stone you have had. °GUIDELINES ACCORDING TO TYPE OF KIDNEY STONE °Calcium Oxalate Kidney Stones °· Reduce the amount of salt you eat. Foods that have a lot of salt cause your body to release excess calcium into your urine. The excess calcium can combine with a substance called oxalate to form kidney stones. °· Reduce the amount of animal protein you eat if the amount you eat is excessive. Animal protein causes your body to release excess calcium into your urine. Ask your dietitian how much protein from animal sources you should be eating. °· Avoid foods that are high in oxalates. If you take vitamins, they should have less than 500 mg of vitamin C. Your body turns vitamin C into oxalates. You do not need to avoid fruits and vegetables high in vitamin C. °Calcium Phosphate Kidney Stones °· Reduce the amount of salt you eat to help prevent the release of excess calcium into your urine. °· Reduce the amount of animal protein you eat if the amount you eat is excessive. Animal protein causes your body to release excess calcium into your urine. Ask your dietitian how much protein from animal sources you should be eating. °· Get enough calcium from food or take a calcium supplement (ask your dietitian for recommendations). Food sources of calcium that do not increase your risk of kidney stones include: °¨ Broccoli. °¨ Dairy products, such as cheese and yogurt. °¨ Pudding. °Uric Acid Kidney Stones °· Do not have more than 6 oz of animal protein per day. °FOOD SOURCES °Animal Protein Sources °· Meat (all types). °· Poultry. °· Eggs. °· Fish, seafood. °Foods High in Salt °· Salt seasonings. °· Soy  sauce. °· Teriyaki sauce. °· Cured and processed meats. °· Salted crackers and snack foods. °· Fast food. °· Canned soups and most canned foods. °Foods High in Oxalates °· Grains: °¨ Amaranth. °¨ Barley. °¨ Grits. °¨ Wheat germ. °¨ Bran. °¨ Buckwheat flour. °¨ All bran cereals. °¨ Pretzels. °¨ Whole wheat bread. °· Vegetables: °¨ Beans (wax). °¨ Beets and beet greens. °¨ Collard greens. °¨ Eggplant. °¨ Escarole. °¨ Leeks. °¨ Okra. °¨ Parsley. °¨ Rutabagas. °¨ Spinach. °¨ Swiss chard. °¨ Tomato paste. °¨ Fried potatoes. °¨ Sweet potatoes. °· Fruits: °¨ Red currants. °¨ Figs. °¨ Kiwi. °¨ Rhubarb. °· Meat and Other Protein Sources: °¨ Beans (dried). °¨ Soy burgers and other soybean products. °¨ Miso. °¨ Nuts (peanuts, almonds, pecans, cashews, hazelnuts). °¨ Nut butters. °¨ Sesame seeds and tahini (paste made of sesame seeds). °¨ Poppy seeds. °· Beverages: °¨ Chocolate drink mixes. °¨ Soy milk. °¨ Instant iced tea. °¨ Juices made from high-oxalate fruits or vegetables. °· Other: °¨ Carob. °¨ Chocolate. °¨ Fruitcake. °¨ Marmalades. °  °This information is not intended to replace advice given to you by your health care provider. Make sure you discuss any questions you have with your health care provider. °  °Document Released: 11/24/2010 Document Revised: 08/04/2013 Document Reviewed: 06/26/2013 °Elsevier Interactive Patient Education ©2016 [employer] ° ° °Kidney Stones °Kidney stones (urolithiasis) are deposits that form inside your kidneys. The intense pain is caused by the stone moving through the urinary tract. When the stone moves, the   ureter goes into spasm around the stone. The stone is usually passed in the urine.  °CAUSES  °· A disorder that makes certain neck glands produce too much parathyroid hormone (primary hyperparathyroidism). °· A buildup of uric acid crystals, similar to gout in your joints. °· Narrowing (stricture) of the ureter. °· A kidney obstruction present at birth (congenital  obstruction). °· Previous surgery on the kidney or ureters. °· Numerous kidney infections. °SYMPTOMS  °· Feeling sick to your stomach (nauseous). °· Throwing up (vomiting). °· Blood in the urine (hematuria). °· Pain that usually spreads (radiates) to the groin. °· Frequency or urgency of urination. °DIAGNOSIS  °· Taking a history and physical exam. °· Blood or urine tests. °· CT scan. °· Occasionally, an examination of the inside of the urinary bladder (cystoscopy) is performed. °TREATMENT  °· Observation. °· Increasing your fluid intake. °· Extracorporeal shock wave lithotripsy--This is a noninvasive procedure that uses shock waves to break up kidney stones. °· Surgery may be needed if you have severe pain or persistent obstruction. There are various surgical procedures. Most of the procedures are performed with the use of small instruments. Only small incisions are needed to accommodate these instruments, so recovery time is minimized. °The size, location, and chemical composition are all important variables that will determine the proper choice of action for you. Talk to your health care provider to better understand your situation so that you will minimize the risk of injury to yourself and your kidney.  °HOME CARE INSTRUCTIONS  °· Drink enough water and fluids to keep your urine clear or pale yellow. This will help you to pass the stone or stone fragments. °· Strain all urine through the provided strainer. Keep all particulate matter and stones for your health care provider to see. The stone causing the pain may be as small as a grain of salt. It is very important to use the strainer each and every time you pass your urine. The collection of your stone will allow your health care provider to analyze it and verify that a stone has actually passed. The stone analysis will often identify what you can do to reduce the incidence of recurrences. °· Only take over-the-counter or prescription medicines for pain,  discomfort, or fever as directed by your health care provider. °· Keep all follow-up visits as told by your health care provider. This is important. °· Get follow-up X-rays if required. The absence of pain does not always mean that the stone has passed. It may have only stopped moving. If the urine remains completely obstructed, it can cause loss of kidney function or even complete destruction of the kidney. It is your responsibility to make sure X-rays and follow-ups are completed. Ultrasounds of the kidney can show blockages and the status of the kidney. Ultrasounds are not associated with any radiation and can be performed easily in a matter of minutes. °· Make changes to your daily diet as told by your health care provider. You may be told to: °¨ Limit the amount of salt that you eat. °¨ Eat 5 or more servings of fruits and vegetables each day. °¨ Limit the amount of meat, poultry, fish, and eggs that you eat. °· Collect a 24-hour urine sample as told by your health care provider. You may need to collect another urine sample every 6-12 months. °SEEK MEDICAL CARE IF: °· You experience pain that is progressive and unresponsive to any pain medicine you have been prescribed. °SEEK IMMEDIATE MEDICAL CARE IF:  °·   Pain cannot be controlled with the prescribed medicine. °· You have a fever or shaking chills. °· The severity or intensity of pain increases over 18 hours and is not relieved by pain medicine. °· You develop a new onset of abdominal pain. °· You feel faint or pass out. °· You are unable to urinate. °  °This information is not intended to replace advice given to you by your health care provider. Make sure you discuss any questions you have with your health care provider. °  °Document Released: 07/30/2005 Document Revised: 04/20/2015 Document Reviewed: 12/31/2012 °Elsevier Interactive Patient Education ©2016 [employer] ° °

## 2015-10-03 NOTE — ED Provider Notes (Signed)
CSN: [id number]     Arrival date & time 10/03/15  1502 History   First MD Initiated Contact with Patient 10/03/15 1622     Chief Complaint  Patient presents with  . Flank Pain    HPI   50 year old female presents today with complaints of right back and flank pain. Patient reports symptoms started 2 days ago with sharp colicky pain to her right flank. She reports radiation of symptoms down into her right lower abdomen. She denies any abdominal pain, nausea, vomiting or fever, chills. Patient reports she has not tried any medications prior to arrival. Patient denies any urinary complaints, vaginal discharge or bleeding. No history of the same. She states the symptoms are not made worse with palpation, but can reproduce symptoms to some extent with forward flexion of the lumbar spine and walking.    Past Medical History  Diagnosis Date  . Diabetes mellitus without complication (HCC)   . Hypertension   . Bipolar 1 disorder [facility])    Past Surgical History  Procedure Laterality Date  . Appendectomy    . Cholecystectomy     No family history on file. Social History  Substance Use Topics  . Smoking status: Never Smoker   . Smokeless tobacco: Never Used  . Alcohol Use: No   OB History    No data available     Review of Systems  All other systems reviewed and are negative.   Allergies  Codeine and Ibuprofen  Home Medications   Prior to Admission medications   Medication Sig Start Date End Date Taking? Authorizing Provider  polyethylene glycol powder (MIRALAX) powder Take 1 Container by mouth once.   Yes Historical Provider, MD  insulin glargine (LANTUS) 100 UNIT/ML injection Inject 35 Units into the skin 2 (two) times daily.    Historical Provider, MD  metFORMIN (GLUCOPHAGE) 1000 MG tablet Take 1,000 mg by mouth 2 (two) times daily with a meal.    Historical Provider, MD  Paliperidone (INVEGA PO) Take by mouth.    Historical Provider, MD  traMADol (ULTRAM) 50 MG tablet Take 1  tablet (50 mg total) by mouth every 6 (six) hours as needed. 10/03/15   Eyvonne Mechanic, PA-C  traZODone (DESYREL) 100 MG tablet Take 100 mg by mouth at bedtime.    Historical Provider, MD   BP 119/66 mmHg  Pulse 80  Temp(Src) 97.4 F (36.3 C) (Oral)  Resp 18  Ht  (1.575 m)  Wt 107.049 kg  BMI 43.15 kg/m2  SpO2 99%   Physical Exam  Constitutional: She is oriented to person, place, and time. She appears well-developed and well-nourished.  HENT:  Head: Normocephalic and atraumatic.  Eyes: Conjunctivae are normal. Pupils are equal, round, and reactive to light. Right eye exhibits no discharge. Left eye exhibits no discharge. No scleral icterus.  Neck: Normal range of motion. No JVD present. No tracheal deviation present.  Cardiovascular: Regular rhythm, normal heart sounds and intact distal pulses.  Exam reveals no gallop and no friction rub.   No murmur heard. Pulmonary/Chest: Effort normal and breath sounds normal. No stridor. No respiratory distress. She has no wheezes. She has no rales. She exhibits no tenderness.  Abdominal: There is no hepatosplenomegaly, splenomegaly or hepatomegaly. There is no tenderness. There is no rigidity, no rebound, no guarding, no CVA tenderness, no tenderness at McBurney's point and negative Murphy's sign.  Musculoskeletal: Normal range of motion. She exhibits no edema or tenderness.  Neurological: She is alert and oriented to person,  place, and time. Coordination normal.  Skin: Skin is warm and dry. No rash noted. No erythema. No pallor.  Psychiatric: She has a normal mood and affect. Her behavior is normal. Judgment and thought content normal.  Nursing note and vitals reviewed.   ED Course  Procedures (including critical care time) Labs Review Labs Reviewed  URINALYSIS, ROUTINE W REFLEX MICROSCOPIC (NOT AT [facility]) - Abnormal; Notable for the following:    Specific Gravity, Urine 1.002 (*)    All other components within normal limits  BASIC  METABOLIC PANEL - Abnormal; Notable for the following:    Potassium 5.3 (*)    All other components within normal limits  PREGNANCY, URINE  CBC WITH DIFFERENTIAL/PLATELET    Imaging Review Ct Renal Stone Study  10/03/2015  CLINICAL DATA:  Right flank and abdominal pain for 2 days. Previous cholecystectomy and appendectomy. EXAM: CT ABDOMEN AND PELVIS WITHOUT CONTRAST TECHNIQUE: Multidetector CT imaging of the abdomen and pelvis was performed following the standard protocol without IV contrast. COMPARISON:  09/22/2015 FINDINGS: Lower chest:  No acute findings. Hepatobiliary: No mass visualized on this un-enhanced exam. Prior cholecystectomy noted. No evidence of biliary dilatation. Pancreas: No mass or inflammatory process identified on this un-enhanced exam. Spleen: Within normal limits in size. Adrenals/Urinary Tract: Calculus or milk of calcium within calyceal diverticulum again seen in midpole right kidney measuring 12 mm. No evidence of hydronephrosis. No evidence of ureteral calculi or dilatation. No bladder calculi identified. Stomach/Bowel: No evidence of obstruction, inflammatory process, or abnormal fluid collections. Mild sigmoid diverticulosis is noted, without evidence of diverticulitis. Vascular/Lymphatic: No pathologically enlarged lymph nodes. No evidence of abdominal aortic aneurysm. Aortic atherosclerotic plaque noted. Reproductive: No mass or other significant abnormality. Other: None. Musculoskeletal:  No suspicious bone lesions identified. IMPRESSION: Stable nonobstructive right renal calculus or milk of calcium within caliceal diverticulum. No evidence of ureteral calculi, hydronephrosis, or other acute findings. Colonic diverticulosis. No radiographic evidence of diverticulitis. Electronically Signed   By: Myles Rosenthal M.D.   On: 10/03/2015 17:04   I have personally reviewed and evaluated these images and lab results as part of my medical decision-making.   EKG  Interpretation None      MDM   Final diagnoses:  Kidney stone    Labs: CBC, BMP, urinalysis, urine pregnant  Imaging: CT renal  Consults:  Therapeutics:  Discharge Meds: Tramadol  Assessment/Plan: 50 year old female presents today with complaints of right flank pain. Patient's presentation is most consistent with renal colic. She has no blood in her urine, you know infection to the urinary tract, with no elevation in white blood cells, stable nonobstructive right renal calculus with no evidence of ureteral calculus hydronephrosis or other acute findings. Patient will be instructed to use Tylenol or ibuprofen as needed for discomfort, follow-up with urologist for further evaluation and management of kidney stones. Patient initially refused pain management here in the ED, but then requested later in her visit. Patient's allergies states that she is allergic to tramadol, patient reports that she's taken without palpitation. Unable to prescribe narcotic pain medication for this patient, but she appears to be in no acute distress. Patient's given strict return precautions, she verbalized understanding and agreement to today's plan and had no further questions or concerns at time of discharge         Eyvonne Mechanic, PA-C 10/04/15 0124  Lavera Guise, MD 10/04/15 [phone]

## 2015-10-03 NOTE — ED Notes (Signed)
Pa  at bedside. 

## 2015-12-19 IMAGING — CR DG FOREARM 2V*R*
2 series · 2 of 2 positions shown · non-contrast
Comparison: None.

CLINICAL DATA: Pain post injury yesterday

EXAM:
RIGHT FOREARM - 2 VIEW

[x forearm ap right]
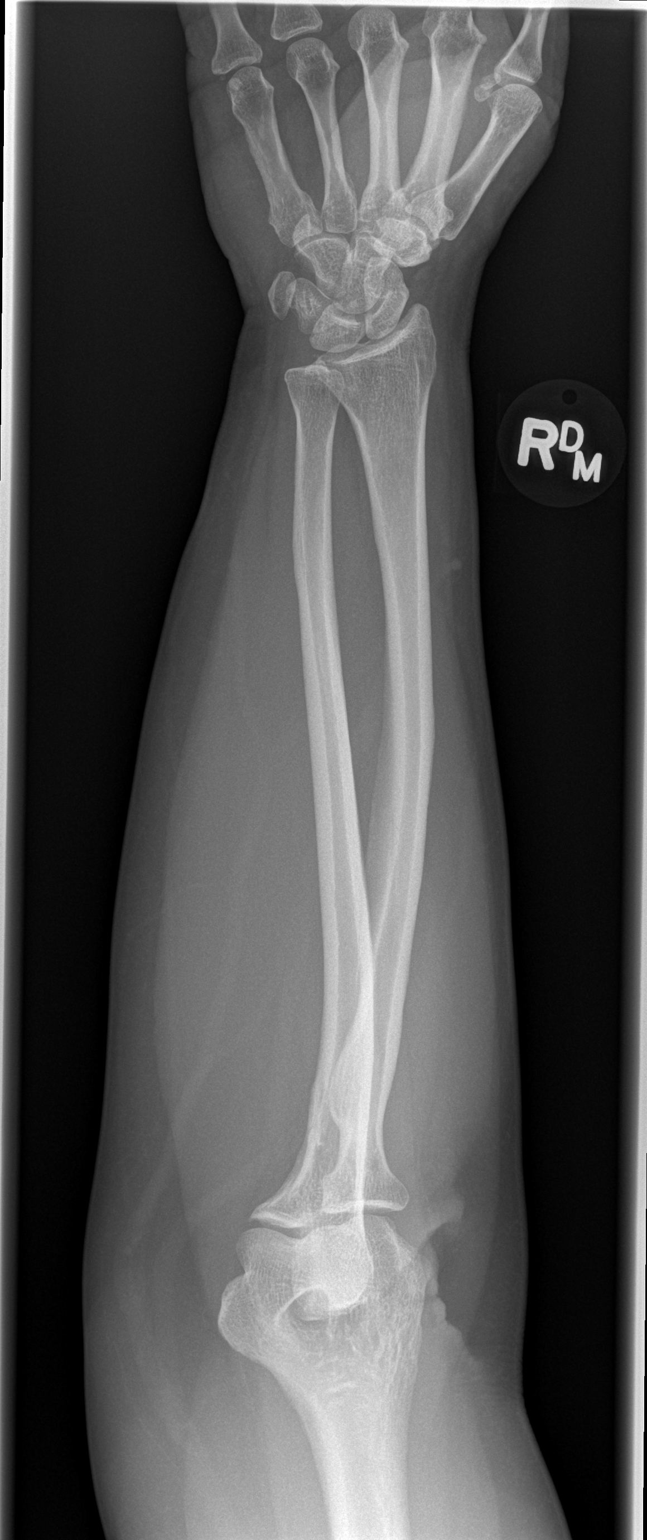

[x forearm lat right]
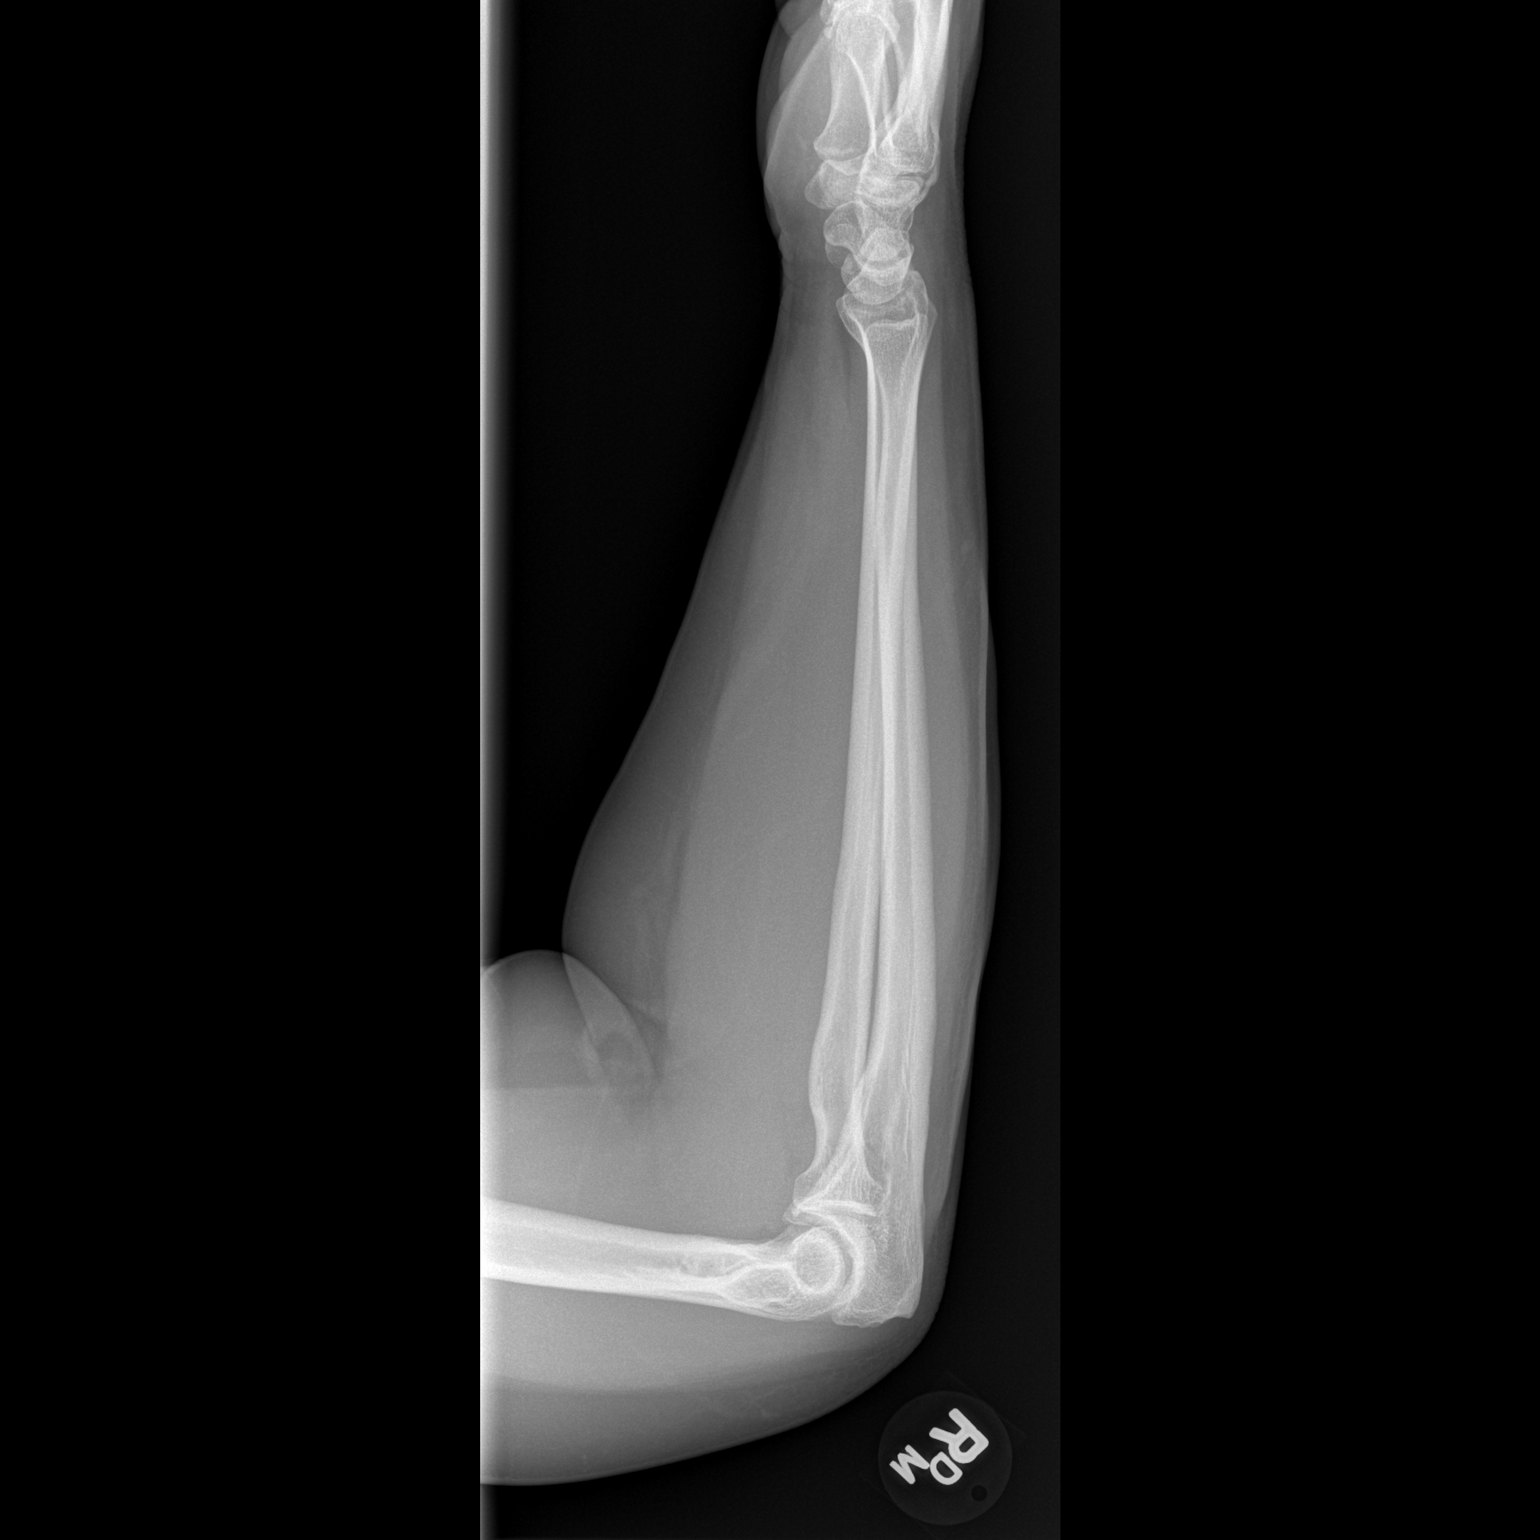

[2 of 2 positions shown; findings below may reference images not displayed]

FINDINGS: There is no evidence of fracture or other focal bone lesions. Soft
tissues are unremarkable.
IMPRESSION: Negative.

## 2015-12-19 IMAGING — CR DG ELBOW COMPLETE 3+V*R*
5 series · 5 of 5 positions shown · non-contrast
Comparison: None.

CLINICAL DATA: Pain post injury yesterday

EXAM:
RIGHT ELBOW - COMPLETE 3+ VIEW

[x elbow joint ap right]
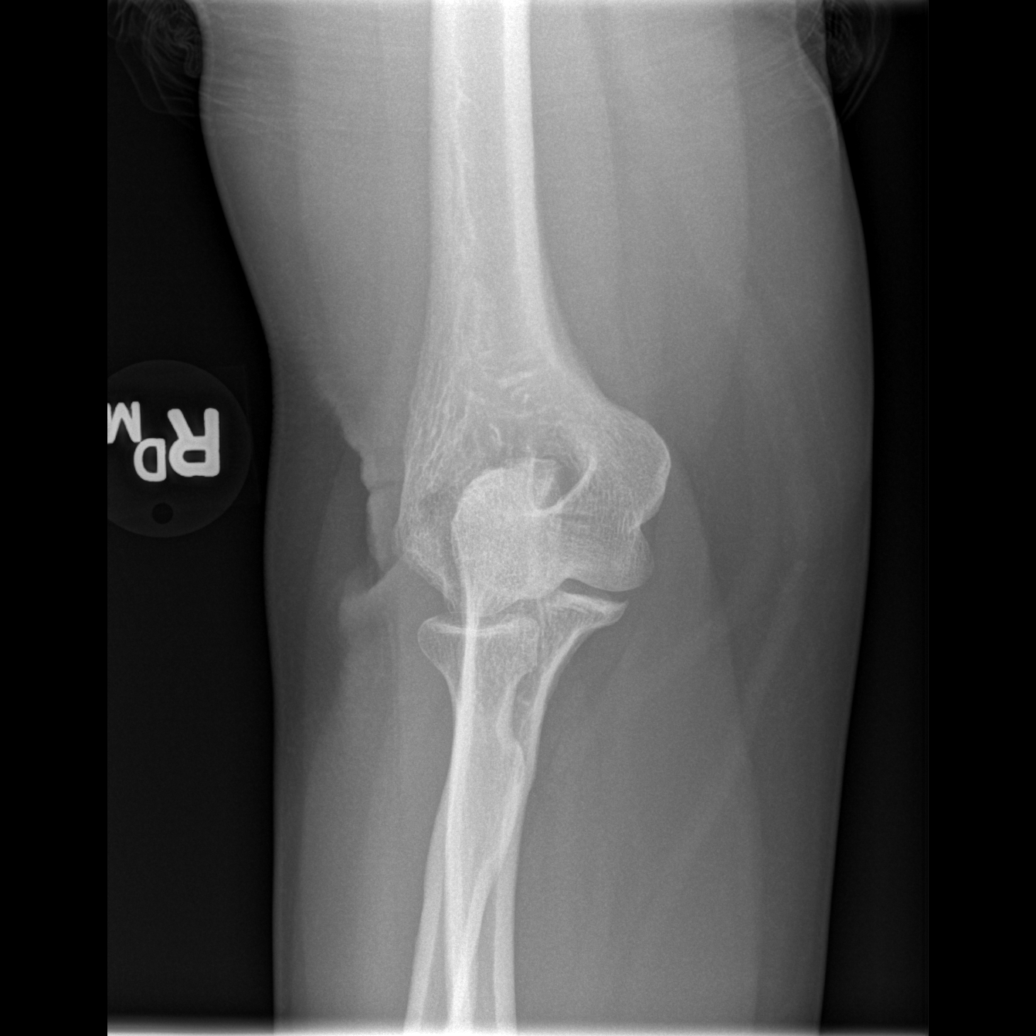

[x elbow joint obl. right (1 of 3)]
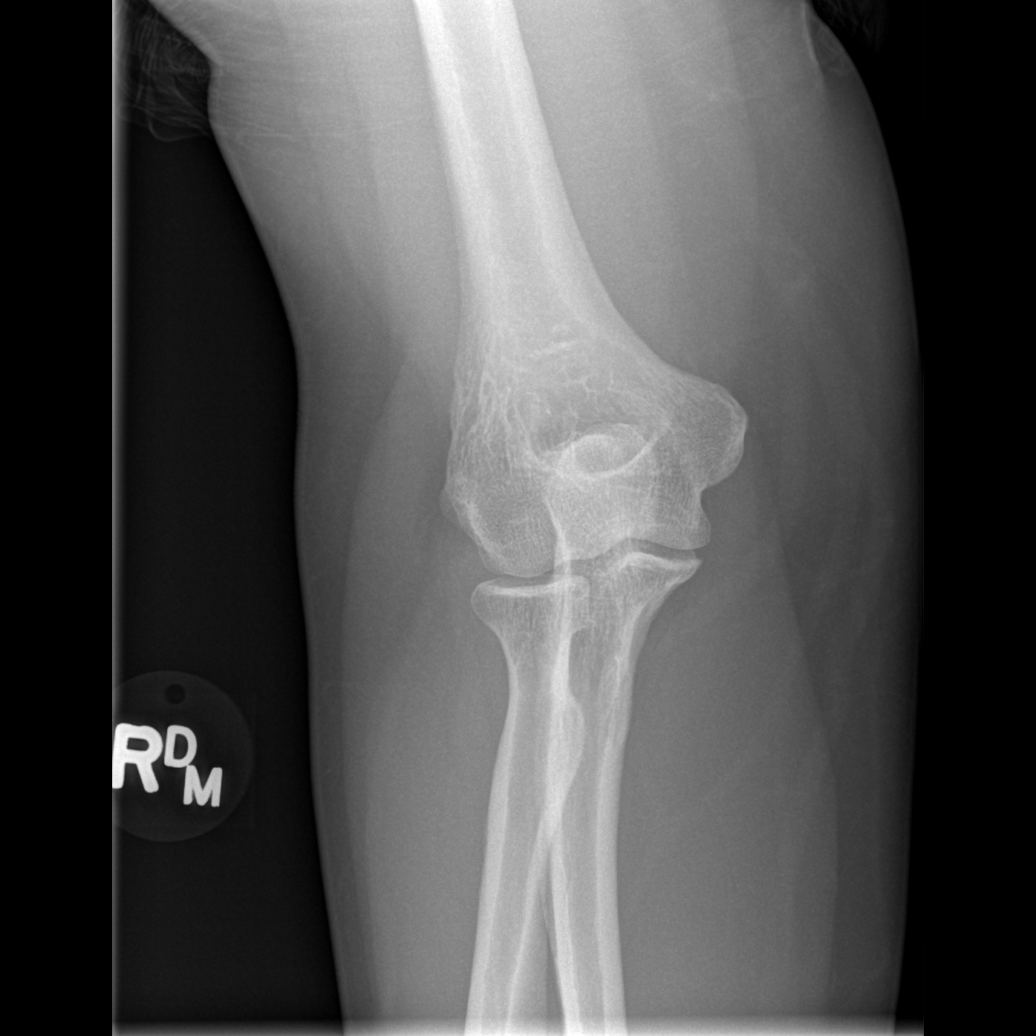

[x elbow joint obl. right (2 of 3)]
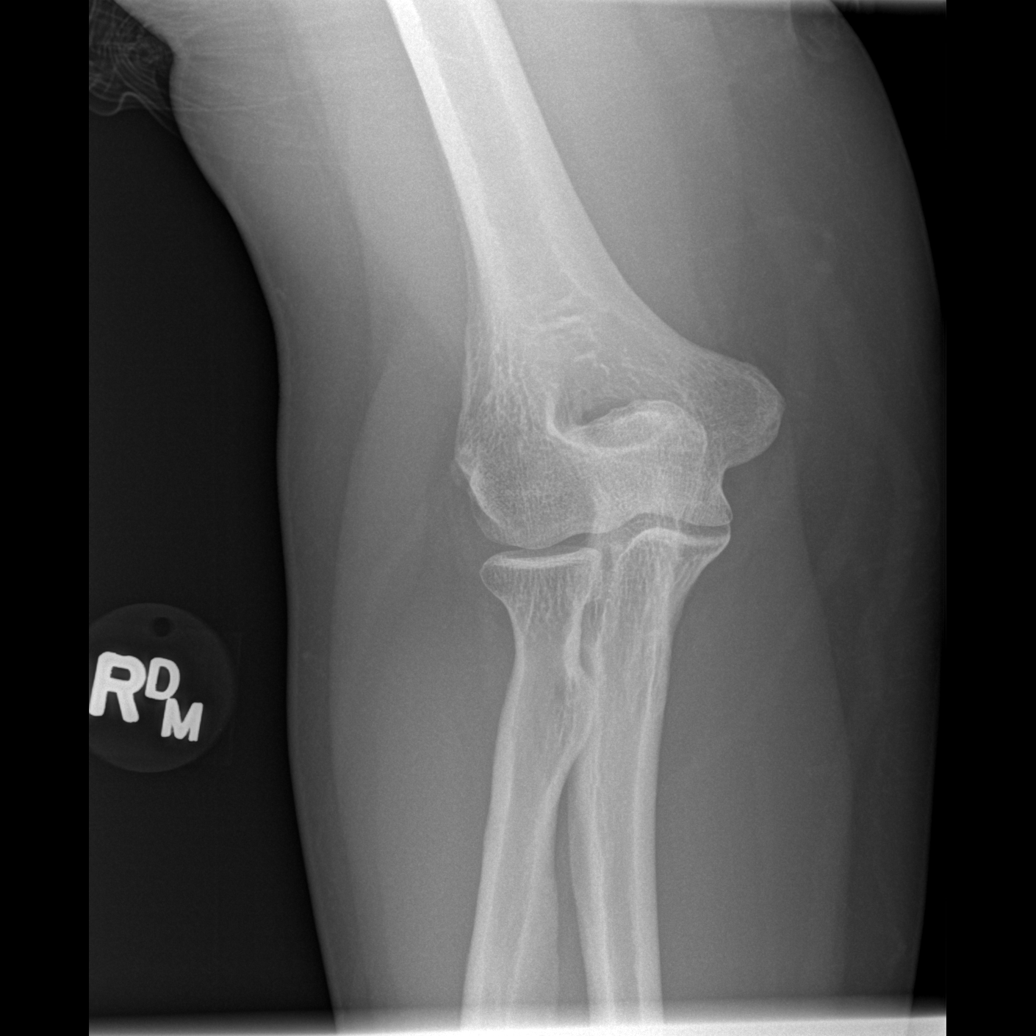

[x elbow joint obl. right (3 of 3)]
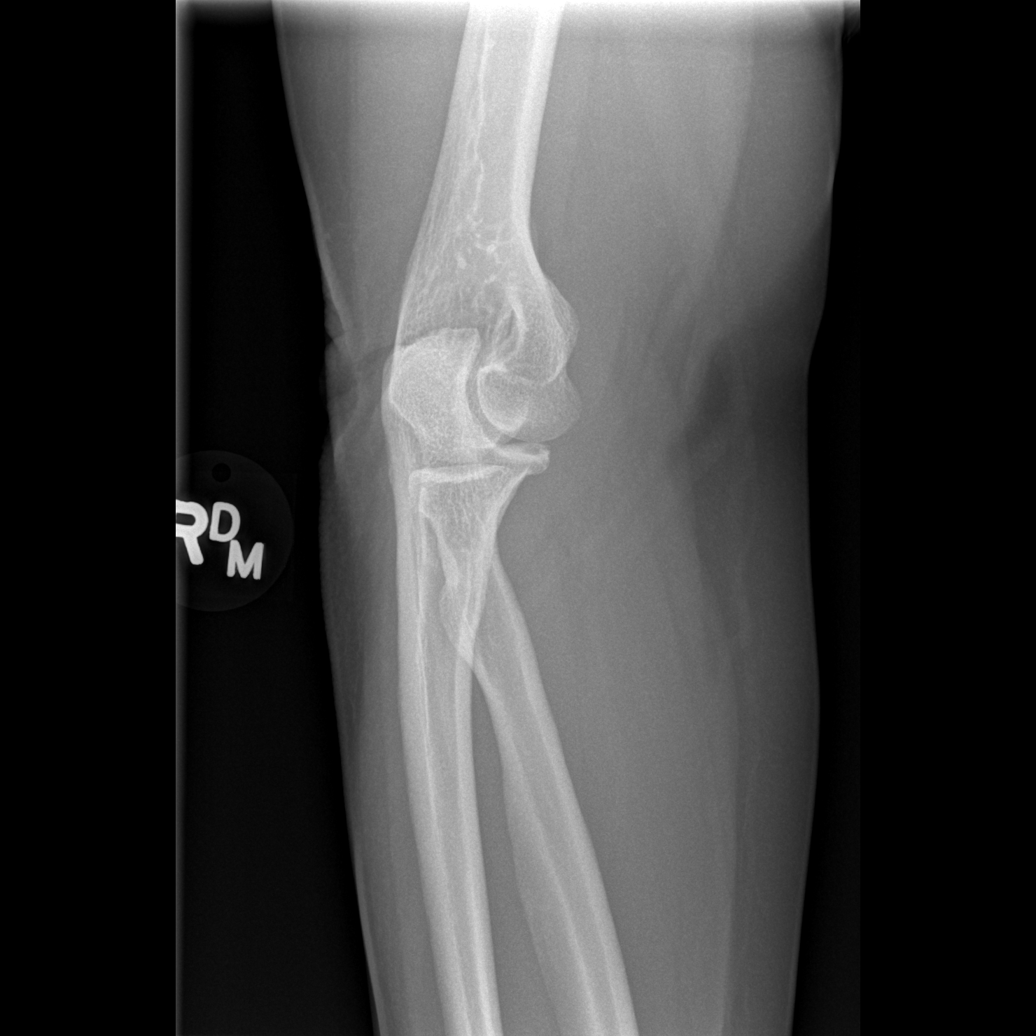

[x elbow joint lat right]
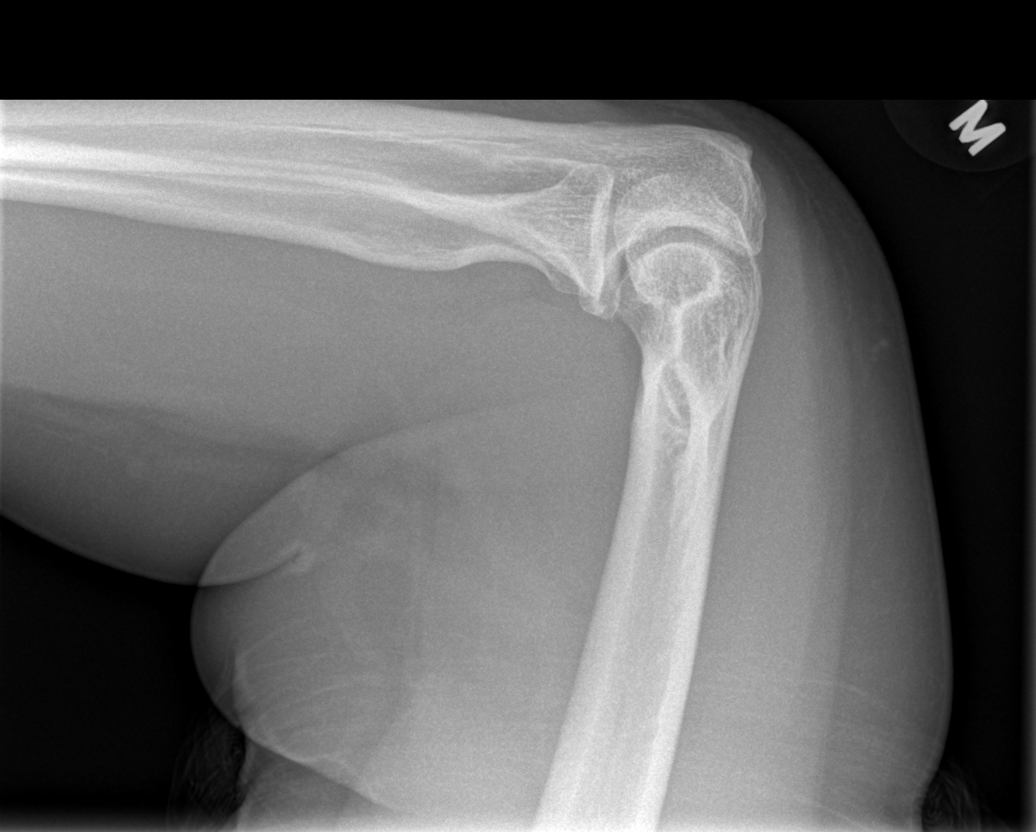

[5 of 5 positions shown; findings below may reference images not displayed]

FINDINGS: Five views of the right elbow submitted. No acute fracture or
subluxation. No radiopaque foreign body. No posterior fat pad sign.
IMPRESSION: Negative.

## 2016-04-25 IMAGING — CT CT RENAL STONE PROTOCOL
2 of 4 series · 17 of 46 positions shown, 19 images · non-contrast
Comparison: 09/22/2015

CLINICAL DATA: Right flank and abdominal pain for 2 days. Previous
cholecystectomy and appendectomy.

EXAM:
CT ABDOMEN AND PELVIS WITHOUT CONTRAST
TECHNIQUE: Multidetector CT imaging of the abdomen and pelvis was performed
following the standard protocol without IV contrast.

[Series 2: axial st · axial · 0.77mm/px · z∈[-694,-239]mm · 14 of 99 slices shown, 16 images]
[im 4/99  soft-tissue]
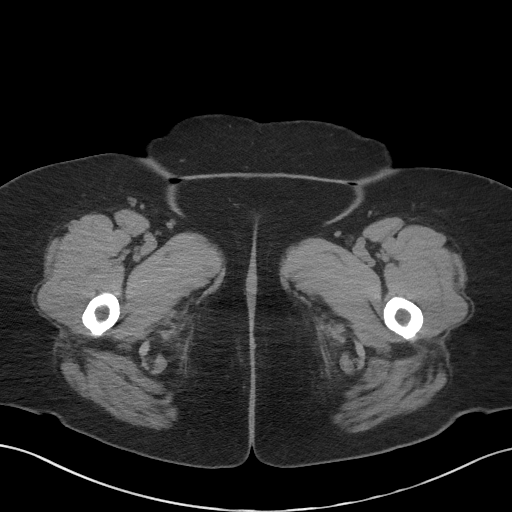
[im 4/99  bone]
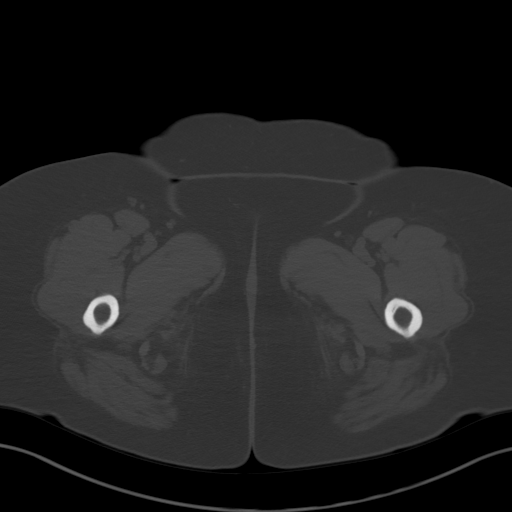
[im 12/99  soft-tissue]
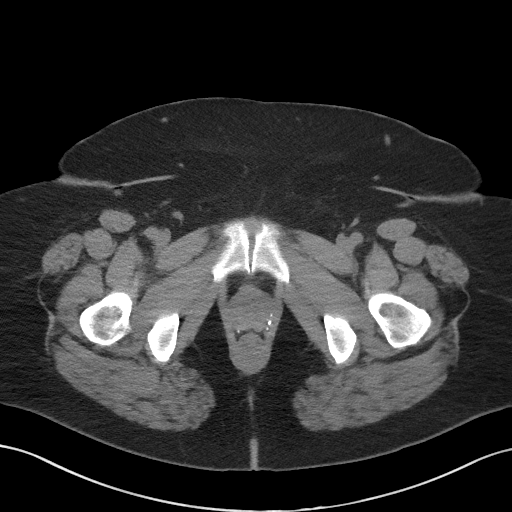
[im 20/99  soft-tissue]
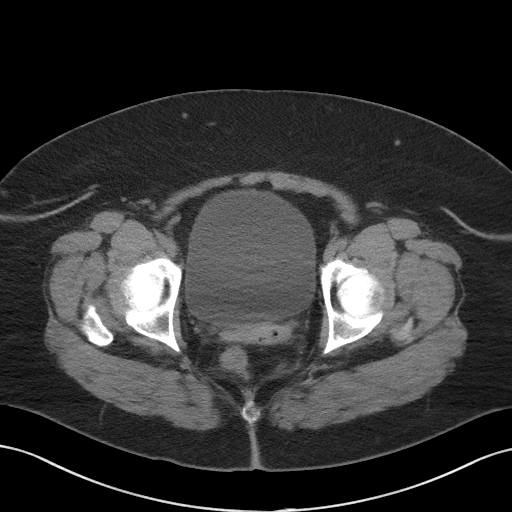
[im 28/99  soft-tissue]
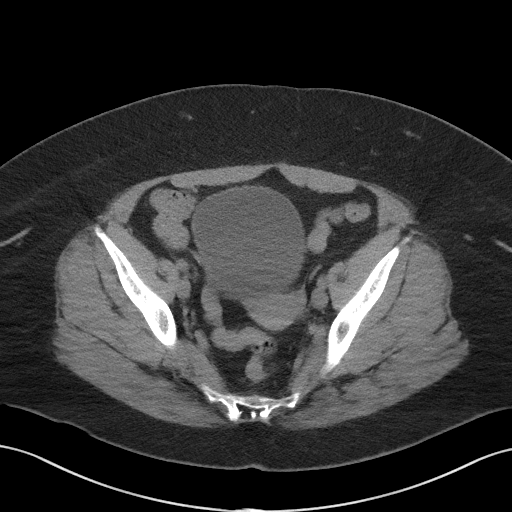
[im 32/99  soft-tissue]
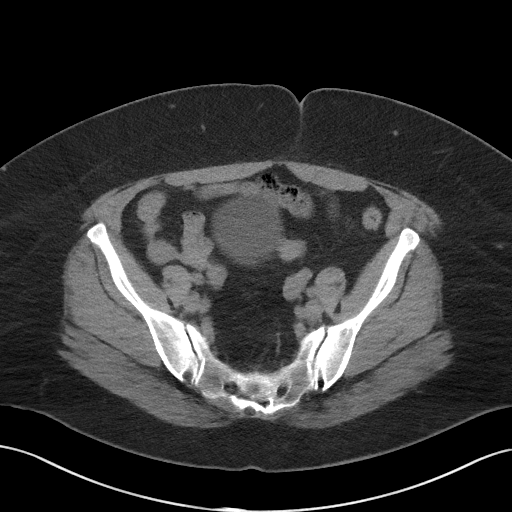
[im 40/99  soft-tissue]
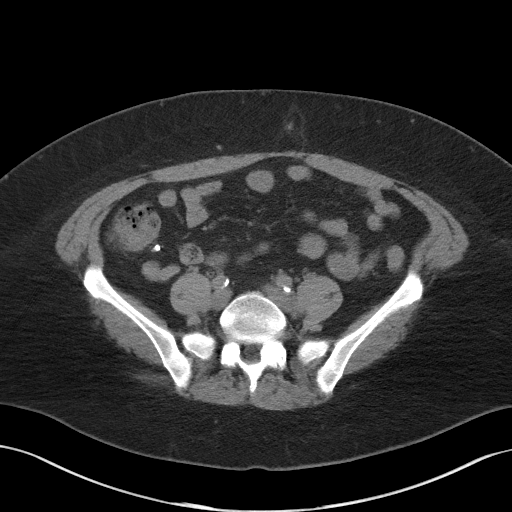
[im 48/99  soft-tissue]
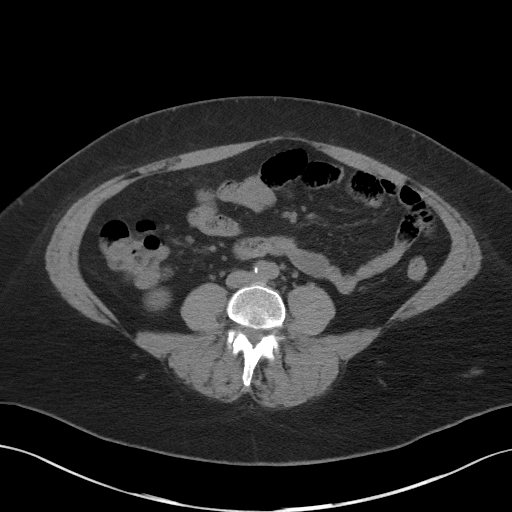
[im 51/99  soft-tissue]
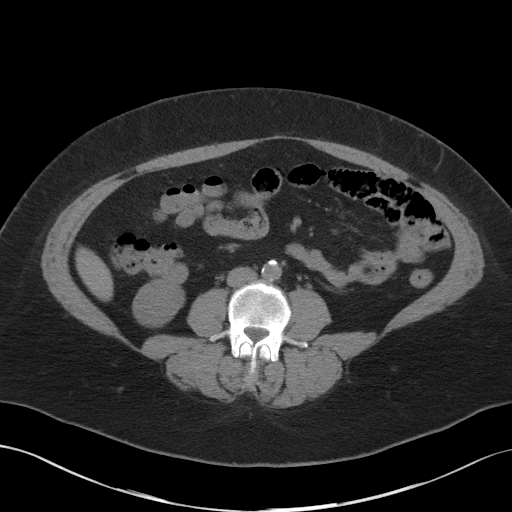
[im 59/99  soft-tissue]
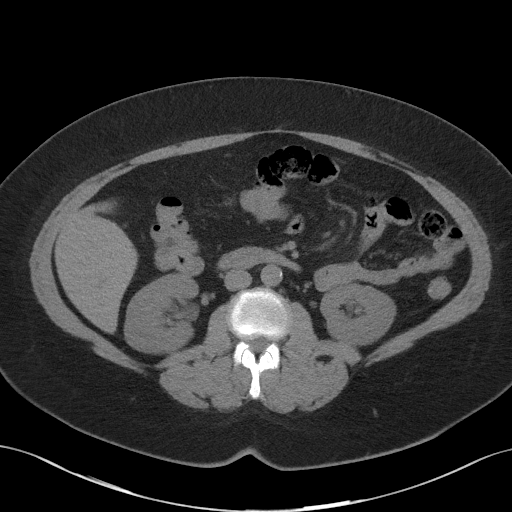
[im 59/99  bone]
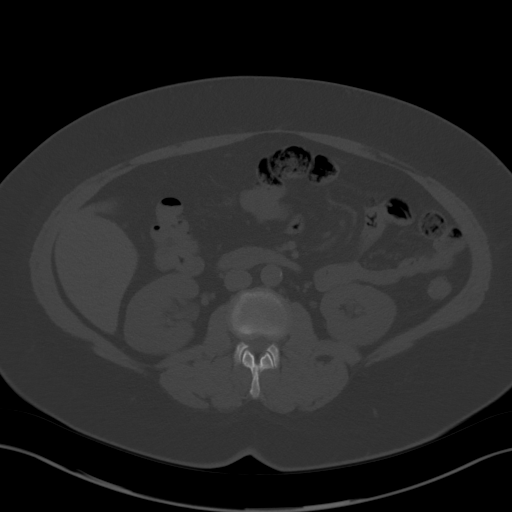
[im 67/99  soft-tissue]
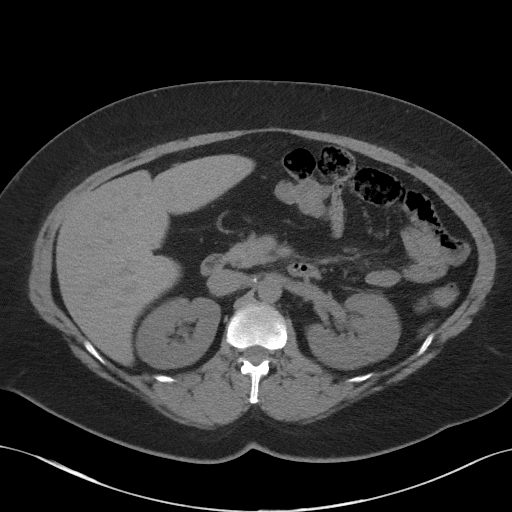
[im 75/99  soft-tissue]
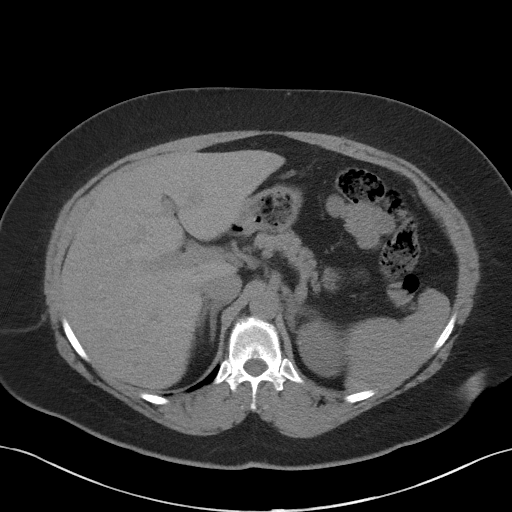
[im 79/99  soft-tissue]
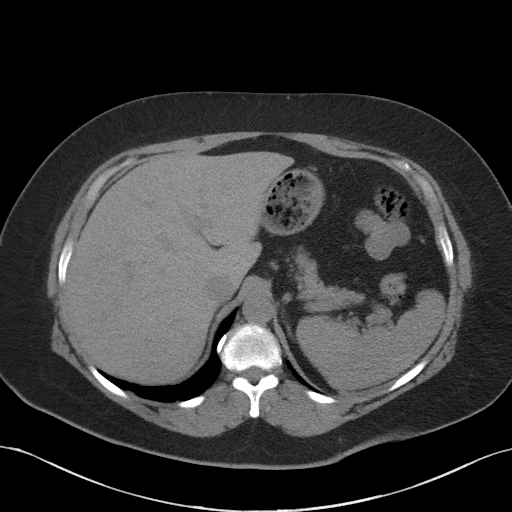
[im 87/99  soft-tissue]
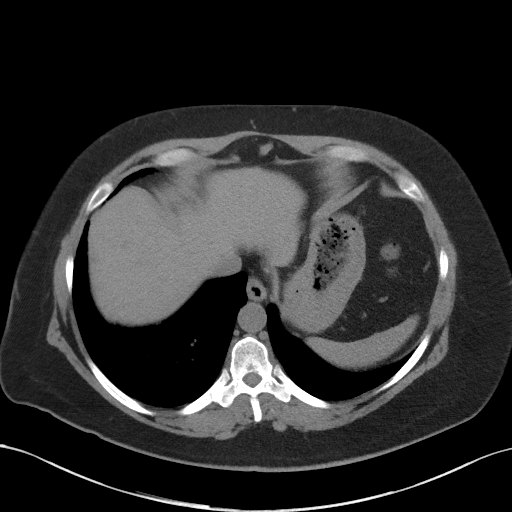
[im 95/99  soft-tissue]
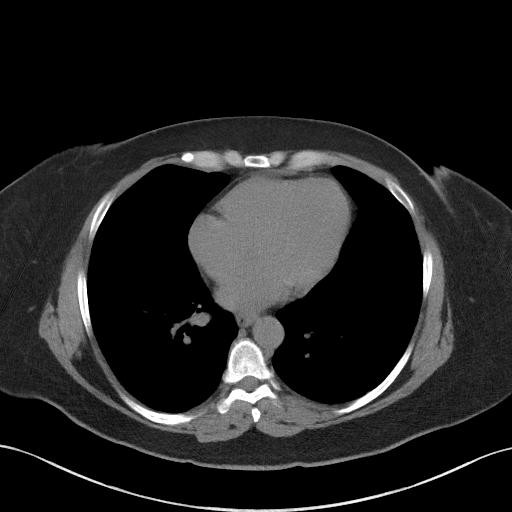

[Series 4: coronal st · coronal · 0.94mm/px · 3 of 110 slices shown]
[im 37/110  soft-tissue]
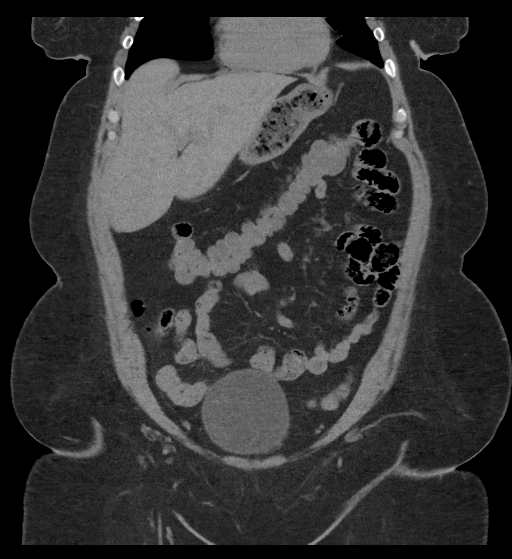
[im 49/110  soft-tissue]
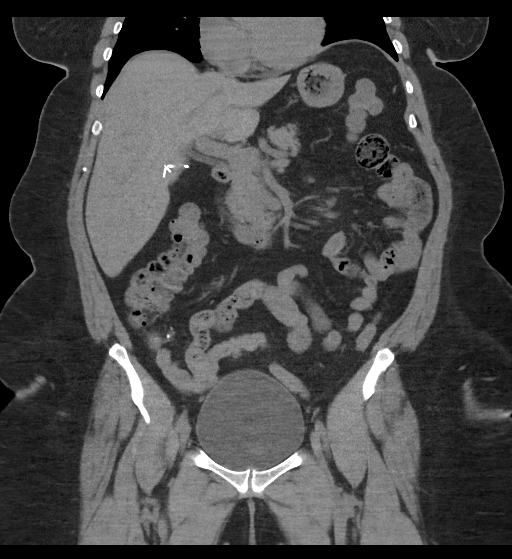
[im 61/110  soft-tissue]
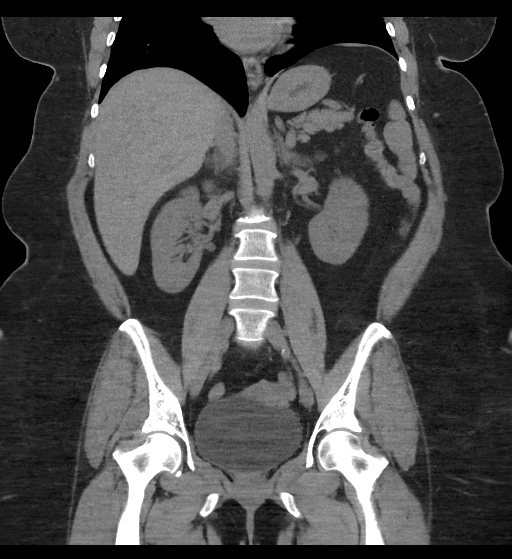

[17 of 46 positions shown; findings below may reference images not displayed]

FINDINGS: Lower chest:  No acute findings.

Hepatobiliary: No mass visualized on this un-enhanced exam. Prior
cholecystectomy noted. No evidence of biliary dilatation.

Pancreas: No mass or inflammatory process identified on this
un-enhanced exam.

Spleen: Within normal limits in size.

Adrenals/Urinary Tract: Calculus or milk of calcium within calyceal
diverticulum again seen in midpole right kidney measuring 12 mm. No
evidence of hydronephrosis. No evidence of ureteral calculi or
dilatation. No bladder calculi identified.

Stomach/Bowel: No evidence of obstruction, inflammatory process, or
abnormal fluid collections. Mild sigmoid diverticulosis is noted,
without evidence of diverticulitis.

Vascular/Lymphatic: No pathologically enlarged lymph nodes. No
evidence of abdominal aortic aneurysm. Aortic atherosclerotic plaque
noted.

Reproductive: No mass or other significant abnormality.

Other: None.

Musculoskeletal:  No suspicious bone lesions identified.
IMPRESSION: Stable nonobstructive right renal calculus or milk of calcium within
caliceal diverticulum. No evidence of ureteral calculi,
hydronephrosis, or other acute findings.

Colonic diverticulosis. No radiographic evidence of diverticulitis.

## 2018-11-13 DIAGNOSIS — Z01818 Encounter for other preprocedural examination: Secondary | ICD-10-CM

## 2019-04-02 ENCOUNTER — Encounter: Payer: Self-pay | Admitting: Gastroenterology

## 2023-04-27 ENCOUNTER — Emergency Department (HOSPITAL_BASED_OUTPATIENT_CLINIC_OR_DEPARTMENT_OTHER)
Admission: EM | Admit: 2023-04-27 | Discharge: 2023-04-27 | Disposition: A | Discharge status: Home / Self Care | Payer: MEDICAID | Attending: Emergency Medicine | Admitting: Emergency Medicine

## 2023-04-27 ENCOUNTER — Encounter (HOSPITAL_BASED_OUTPATIENT_CLINIC_OR_DEPARTMENT_OTHER): Payer: Self-pay

## 2023-04-27 ENCOUNTER — Emergency Department (HOSPITAL_BASED_OUTPATIENT_CLINIC_OR_DEPARTMENT_OTHER): Payer: MEDICAID

## 2023-04-27 DIAGNOSIS — R39198 Other difficulties with micturition: Secondary | ICD-10-CM | POA: Diagnosis present

## 2023-04-27 DIAGNOSIS — N3090 Cystitis, unspecified without hematuria: Secondary | ICD-10-CM | POA: Diagnosis not present

## 2023-04-27 DIAGNOSIS — Z794 Long term (current) use of insulin: Secondary | ICD-10-CM | POA: Insufficient documentation

## 2023-04-27 HISTORY — DX: Hyperlipidemia, unspecified: E78.5

## 2023-04-27 LAB — RAPID URINE DRUG SCREEN, HOSP PERFORMED
Amphetamines: NOT DETECTED
Barbiturates: NOT DETECTED
Benzodiazepines: NOT DETECTED
Cocaine: NOT DETECTED
Opiates: NOT DETECTED
Tetrahydrocannabinol: NOT DETECTED

## 2023-04-27 LAB — CBC WITH DIFFERENTIAL/PLATELET
Abs Immature Granulocytes: 0.02 10*3/uL (ref 0.00–0.07)
Basophils Absolute: 0 10*3/uL (ref 0.0–0.1)
Basophils Relative: 1 %
Eosinophils Absolute: 0.1 10*3/uL (ref 0.0–0.5)
Eosinophils Relative: 1 %
HCT: 40.3 % (ref 36.0–46.0)
Hemoglobin: 14.2 g/dL (ref 12.0–15.0)
Immature Granulocytes: 0 %
Lymphocytes Relative: 32 %
Lymphs Abs: 1.8 10*3/uL (ref 0.7–4.0)
MCH: 33.2 pg (ref 26.0–34.0)
MCHC: 35.2 g/dL (ref 30.0–36.0)
MCV: 94.2 fL (ref 80.0–100.0)
Monocytes Absolute: 0.5 10*3/uL (ref 0.1–1.0)
Monocytes Relative: 9 %
Neutro Abs: 3.3 10*3/uL (ref 1.7–7.7)
Neutrophils Relative %: 57 %
Platelets: 192 10*3/uL (ref 150–400)
RBC: 4.28 MIL/uL (ref 3.87–5.11)
RDW: 11.9 % (ref 11.5–15.5)
Smear Review: NORMAL
WBC: 5.8 10*3/uL (ref 4.0–10.5)
nRBC: 0 % (ref 0.0–0.2)

## 2023-04-27 LAB — URINALYSIS, ROUTINE W REFLEX MICROSCOPIC
Bilirubin Urine: NEGATIVE
Glucose, UA: NEGATIVE mg/dL
Ketones, ur: NEGATIVE mg/dL
Leukocytes,Ua: NEGATIVE
Nitrite: POSITIVE — AB
Protein, ur: NEGATIVE mg/dL
Specific Gravity, Urine: <=1.005 (ref 1.005–1.030)
pH: 6 (ref 5.0–8.0)

## 2023-04-27 LAB — COMPREHENSIVE METABOLIC PANEL
ALT: 26 U/L (ref 0–44)
AST: 29 U/L (ref 15–41)
Albumin: 3.7 g/dL (ref 3.5–5.0)
Alkaline Phosphatase: 56 U/L (ref 38–126)
Anion gap: 8 (ref 5–15)
BUN: 11 mg/dL (ref 6–20)
CO2: 24 mmol/L (ref 22–32)
Calcium: 8.8 mg/dL — ABNORMAL LOW (ref 8.9–10.3)
Chloride: 106 mmol/L (ref 98–111)
Creatinine, Ser: 0.62 mg/dL (ref 0.44–1.00)
GFR, Estimated: >60 mL/min (ref >60)
Glucose, Bld: 95 mg/dL (ref 70–99)
Potassium: 3.6 mmol/L (ref 3.5–5.1)
Sodium: 138 mmol/L (ref 135–145)
Total Bilirubin: 0.9 mg/dL (ref 0.3–1.2)
Total Protein: 6.8 g/dL (ref 6.5–8.1)

## 2023-04-27 LAB — TROPONIN I (HIGH SENSITIVITY)
Troponin I (High Sensitivity): 5 ng/L (ref <18)
Troponin I (High Sensitivity): 6 ng/L (ref <18)

## 2023-04-27 LAB — ETHANOL: Alcohol, Ethyl (B): <10 mg/dL (ref <10)

## 2023-04-27 LAB — URINALYSIS, MICROSCOPIC (REFLEX)

## 2023-04-27 LAB — LACTIC ACID, PLASMA: Lactic Acid, Venous: 0.7 mmol/L (ref 0.5–1.9)

## 2023-04-27 LAB — CBG MONITORING, ED: Glucose-Capillary: 114 mg/dL — ABNORMAL HIGH (ref 70–99)

## 2023-04-27 LAB — PREGNANCY, URINE: Preg Test, Ur: NEGATIVE

## 2023-04-27 MED ORDER — SODIUM CHLORIDE 0.9 % IV BOLUS
1000.0000 mL | Freq: Once | INTRAVENOUS | Status: AC
  Administered 2023-04-27: 1000 mL via INTRAVENOUS

## 2023-04-27 MED ORDER — SODIUM CHLORIDE 0.9 % IV SOLN
1.0000 g | Freq: Once | INTRAVENOUS | Status: AC
  Administered 2023-04-27: 1 g via INTRAVENOUS
  Filled 2023-04-27: qty 10

## 2023-04-27 MED ORDER — ACETAMINOPHEN 325 MG PO TABS
650.0000 mg | ORAL_TABLET | Freq: Once | ORAL | Status: AC
  Administered 2023-04-27: 650 mg via ORAL
  Filled 2023-04-27: qty 2

## 2023-04-27 MED ORDER — CEPHALEXIN 500 MG PO CAPS: 500.0000 mg | ORAL_CAPSULE | Freq: Four times a day (QID) | ORAL | 0 refills | Status: AC

## 2023-04-27 MED ORDER — IOHEXOL 300 MG/ML  SOLN
100.0000 mL | Freq: Once | INTRAMUSCULAR | Status: AC | PRN
  Administered 2023-04-27: 100 mL via INTRAVENOUS

## 2023-04-27 MED ORDER — SODIUM CHLORIDE 0.9 % IV SOLN: INTRAVENOUS | Status: DC | PRN

## 2023-04-27 NOTE — ED Notes (Signed)
Patient transported to CT 

## 2023-04-27 NOTE — ED Triage Notes (Addendum)
Pt accompanied by mother. Pt lethargic, responsive to painful  stimuli  Required assistance to get out of car due to weakness Able to answer questions once in room Pt reports she had diarrhea all night and unable to urinate. States passed out in shower due to weakness. Unable to urinate. Hx of colitis ,but states diarrhea usually not as bad

## 2023-04-27 NOTE — Discharge Instructions (Signed)
You can take Keflex 4 times per day for the next 1 week.  I would also recommend picking up an over-the-counter probiotic.  These are all the same, and you can just purchase was verbal and is cheapest.  Please follow-up with your primary care doctor within 1 week to discuss these symptoms.

## 2023-04-27 NOTE — ED Provider Notes (Signed)
[place] [facility] Provider Note   CSN: [id number] Arrival date & time: 04/27/23  1539     History  Chief Complaint  Patient presents with   Diarrhea    Megan Werner is a 57 y.o. female.  This is a 57 year old female who presents emergency department today due to weakness.  Reportedly, patient has been having diarrhea, and she is feeling very weak.  Patient required assistance from the car into the emergency room, however very quickly became a lot more alert.  Patient was seen at [facility] on the 8th, had imaging done and was diagnosed with mild urinary retention, colitis.  I reviewed their note.   Diarrhea      Home Medications Prior to Admission medications   Medication Sig Start Date End Date Taking? Authorizing Provider  cephALEXin (KEFLEX) 500 MG capsule Take 1 capsule (500 mg total) by mouth 4 (four) times daily for 7 days. 04/27/23 05/04/23 Yes Anders Simmonds T, DO  insulin glargine (LANTUS) 100 UNIT/ML injection Inject 35 Units into the skin 2 (two) times daily.    [provider]  metFORMIN (GLUCOPHAGE) 1000 MG tablet Take 1,000 mg by mouth 2 (two) times daily with a meal.    [provider]  Paliperidone (INVEGA PO) Take by mouth.    [provider]  polyethylene glycol powder (MIRALAX) powder Take 1 Container by mouth once.    [provider]  traMADol (ULTRAM) 50 MG tablet Take 1 tablet (50 mg total) by mouth every 6 (six) hours as needed. 10/03/15   Hedges, Tinnie Gens, PA-C  traZODone (DESYREL) 100 MG tablet Take 100 mg by mouth at bedtime.    [provider]      Allergies    Codeine and Ibuprofen    Review of Systems   Review of Systems  Gastrointestinal:  Positive for diarrhea.    Physical Exam Updated Vital Signs BP (!) 136/96   Pulse 69   Temp 98.7 F (37.1 C) (Oral)   Resp 20   SpO2 98%  Physical Exam Vitals reviewed.  Constitutional:      General: She is  not in acute distress.    Appearance: She is not toxic-appearing.  HENT:     Head: Normocephalic and atraumatic.  Eyes:     Comments: Patient refuses to open her eyes stating she is too weak  Cardiovascular:     Rate and Rhythm: Normal rate and regular rhythm.  Pulmonary:     Effort: Pulmonary effort is normal.  Abdominal:     General: Abdomen is flat. There is no distension.     Palpations: Abdomen is soft. There is no mass.     Tenderness: There is no abdominal tenderness. There is no guarding.  Musculoskeletal:     Cervical back: Normal range of motion and neck supple.  Skin:    General: Skin is warm and dry.  Neurological:     Mental Status: Mental status is at baseline.     ED Results / Procedures / Treatments   Labs (all labs ordered are listed, but only abnormal results are displayed) Labs Reviewed  URINALYSIS, ROUTINE W REFLEX MICROSCOPIC - Abnormal; Notable for the following components:      Result Value   Hgb urine dipstick TRACE (*)    Nitrite POSITIVE (*)    All other components within normal limits  COMPREHENSIVE METABOLIC PANEL - Abnormal; Notable for the following components:   Calcium 8.8 (*)  All other components within normal limits  URINALYSIS, MICROSCOPIC (REFLEX) - Abnormal; Notable for the following components:   Bacteria, UA RARE (*)    All other components within normal limits  CBG MONITORING, ED - Abnormal; Notable for the following components:   Glucose-Capillary 114 (*)    All other components within normal limits  LACTIC ACID, PLASMA  CBC WITH DIFFERENTIAL/PLATELET  PREGNANCY, URINE  RAPID URINE DRUG SCREEN, HOSP PERFORMED  ETHANOL  CBC WITH DIFFERENTIAL/PLATELET  TROPONIN I (HIGH SENSITIVITY)  TROPONIN I (HIGH SENSITIVITY)    EKG EKG Interpretation Date/Time:  Saturday April 27 2023 15:48:25 EDT Ventricular Rate:  84 PR Interval:  142 QRS Duration:  84 QT Interval:  385 QTC Calculation: 456 R Axis:   33  Text  Interpretation: Sinus rhythm No old tracing to compare Confirmed by Meridee Score [phone]) on 04/28/2023 12:53:56 PM  Radiology No results found.  Procedures Procedures    Medications Ordered in ED Medications  sodium chloride 0.9 % bolus 1,000 mL ( Intravenous Stopped 04/27/23 1806)  sodium chloride 0.9 % bolus 1,000 mL ( Intravenous Stopped 04/27/23 1701)  cefTRIAXone (ROCEPHIN) 1 g in sodium chloride 0.9 % 100 mL IVPB (0 g Intravenous Stopped 04/27/23 1917)  iohexol (OMNIPAQUE) 300 MG/ML solution 100 mL (100 mLs Intravenous Contrast Given 04/27/23 1918)  acetaminophen (TYLENOL) tablet 650 mg (650 mg Oral Given 04/27/23 1932)    ED Course/ Medical Decision Making/ A&P                                 Medical Decision Making This is a 57 year old female here today for weakness.  Differential diagnosis include dehydration, colitis, COVID, cystitis, CVA.  Plan-somewhat unusual presentation for the patient.  I observed her being wheeled into the emergency department, her head was rolled back, and she was limp in the chair.  Howeverthe patient's respirations were normal.  She was moved over to the bed, and when the patient's short was removed and gown was brought on, the patient fixed her hair.  When I interviewed the patient, she spoke in a normal voice, however she was unable to open her eyes stating that she was too weak.  Her abdomen is soft, her vital signs are reassuring.  Will repeat some labs and imaging.  I am CT and the patient's head due to her unusual initial presentation.  Will obtain viral swabs on the patient as sometimes COVID can present with abdominal symptoms.  Reassessment- Reviewed the patients labs, no leukocytosis, nitrites in urine. CT abdomen negative. Patient improved, ambulatory. Discharged.  Amount and/or Complexity of Data Reviewed Labs: ordered. Radiology: ordered.  Risk OTC drugs. Prescription drug management.           Final Clinical  Impression(s) / ED Diagnoses Final diagnoses:  Cystitis    Rx / DC Orders ED Discharge Orders          Ordered    cephALEXin (KEFLEX) 500 MG capsule  4 times daily        04/27/23 1943              Anders Simmonds T, DO 05/03/23 [phone]

## 2023-04-28 ENCOUNTER — Telehealth (HOSPITAL_BASED_OUTPATIENT_CLINIC_OR_DEPARTMENT_OTHER): Payer: Self-pay | Admitting: Emergency Medicine

## 2023-10-15 ENCOUNTER — Emergency Department (HOSPITAL_BASED_OUTPATIENT_CLINIC_OR_DEPARTMENT_OTHER)
Admission: EM | Admit: 2023-10-15 | Discharge: 2023-10-15 | Disposition: A | Discharge status: Home / Self Care | Payer: MEDICAID | Attending: Emergency Medicine | Admitting: Emergency Medicine

## 2023-10-15 ENCOUNTER — Other Ambulatory Visit: Payer: Self-pay

## 2023-10-15 DIAGNOSIS — R300 Dysuria: Secondary | ICD-10-CM | POA: Diagnosis present

## 2023-10-15 DIAGNOSIS — Z794 Long term (current) use of insulin: Secondary | ICD-10-CM | POA: Insufficient documentation

## 2023-10-15 DIAGNOSIS — Z7984 Long term (current) use of oral hypoglycemic drugs: Secondary | ICD-10-CM | POA: Insufficient documentation

## 2023-10-15 DIAGNOSIS — E119 Type 2 diabetes mellitus without complications: Secondary | ICD-10-CM | POA: Diagnosis not present

## 2023-10-15 DIAGNOSIS — I10 Essential (primary) hypertension: Secondary | ICD-10-CM | POA: Diagnosis not present

## 2023-10-15 LAB — URINALYSIS, ROUTINE W REFLEX MICROSCOPIC
Bilirubin Urine: NEGATIVE
Glucose, UA: NEGATIVE mg/dL
Hgb urine dipstick: NEGATIVE
Ketones, ur: NEGATIVE mg/dL
Leukocytes,Ua: NEGATIVE
Nitrite: NEGATIVE
Protein, ur: NEGATIVE mg/dL
Specific Gravity, Urine: 1.01 (ref 1.005–1.030)
pH: 6 (ref 5.0–8.0)

## 2023-10-15 NOTE — ED Triage Notes (Signed)
 Pt reports dysuria and lower abdominal pain X 3 days

## 2023-10-15 NOTE — Discharge Instructions (Signed)
 Evaluation today was overall reassuring.  Recommend you follow-up with your PCP especially if your symptoms persist.  They may want to recheck your urine.  If you develop worsening abdominal pain, fever, nausea vomit diarrhea, abnormal vaginal discharge or bleeding or any other concerning symptom please return emergency department further evaluation.

## 2023-10-15 NOTE — ED Provider Notes (Signed)
 [place] [facility] Provider Note   CSN: [id number] Arrival date & time: 10/15/23  1604     History  Chief Complaint  Patient presents with   Dysuria   HPI Megan Werner is a 58 y.o. female with history of appendectomy, cholecystectomy, diabetes and hypertension presenting for dysuria.  States she has had painful urination for the past 3 days and central lower abdominal pain.  She denies nausea vomiting diarrhea she denies abnormal vaginal bleeding or discharge.  States she has had a normal bowel movement today and still passing gas.  States she has been drinking cranberry juice thinking that she may have a UTI.   Dysuria      Home Medications Prior to Admission medications   Medication Sig Start Date End Date Taking? Authorizing Provider  insulin glargine (LANTUS) 100 UNIT/ML injection Inject 35 Units into the skin 2 (two) times daily.    [provider]  metFORMIN (GLUCOPHAGE) 1000 MG tablet Take 1,000 mg by mouth 2 (two) times daily with a meal.    [provider]  Paliperidone (INVEGA PO) Take by mouth.    [provider]  polyethylene glycol powder (MIRALAX) powder Take 1 Container by mouth once.    [provider]  traMADol (ULTRAM) 50 MG tablet Take 1 tablet (50 mg total) by mouth every 6 (six) hours as needed. 10/03/15   Hedges, Tinnie Gens, PA-C  traZODone (DESYREL) 100 MG tablet Take 100 mg by mouth at bedtime.    [provider]      Allergies    Codeine and Ibuprofen    Review of Systems   Review of Systems  Genitourinary:  Positive for dysuria.    Physical Exam   Vitals:   10/15/23 1623 10/15/23 1905  BP: 129/75 121/75  Pulse: 98 84  Resp: 18 17  Temp: 97.8 F (36.6 C)   SpO2: 98% 94%    CONSTITUTIONAL:  well-appearing, NAD NEURO:  Alert and oriented x 3, CN 3-12 grossly intact EYES:  eyes equal and reactive ENT/NECK:  Supple, no stridor  CARDIO:  regular rate and  rhythm, appears well-perfused  PULM:  No respiratory distress, CTAB GI/GU:  non-distended, soft, non tender MSK/SPINE:  No gross deformities, no edema, moves all extremities  SKIN:  no rash, atraumatic   *Additional and/or pertinent findings included in MDM below    ED Results / Procedures / Treatments   Labs (all labs ordered are listed, but only abnormal results are displayed) Labs Reviewed  URINALYSIS, ROUTINE W REFLEX MICROSCOPIC    EKG None  Radiology No results found.  Procedures Procedures    Medications Ordered in ED Medications - No data to display  ED Course/ Medical Decision Making/ A&P                                 Medical Decision Making Amount and/or Complexity of Data Reviewed Labs: ordered.   58 year old well-appearing female presenting for dysuria and lower abdominal pain. Exam was unremarkable and abdomen was soft and nontender.  Urinalysis was normal.  Vitals also normal.  Patient appears well, nontoxic and in no acute distress. Considered intra-abdominal infection but unlikely given benign exam, afebrile and the fact that she has had a appendectomy and cholecystectomy in the past.  Given normal urinalysis and no suprapubic tenderness, elected not to treat for UTI. Advised her to follow-up with her PCP.  Discussed  return precautions.  Discharged good condition.  Also considered ovarian torsion but unlikely given no abdominal tenderness and no vaginal bleeding.        Final Clinical Impression(s) / ED Diagnoses Final diagnoses:  Dysuria    Rx / DC Orders ED Discharge Orders     None         Gareth Eagle, PA-C 10/15/23 1927    Franne Forts, DO 10/19/23 1730

## 2024-05-06 ENCOUNTER — Emergency Department (HOSPITAL_BASED_OUTPATIENT_CLINIC_OR_DEPARTMENT_OTHER)
Admission: EM | Admit: 2024-05-06 | Discharge: 2024-05-06 | Disposition: A | Discharge status: Home / Self Care | Payer: MEDICAID | Attending: Emergency Medicine | Admitting: Emergency Medicine

## 2024-05-06 ENCOUNTER — Emergency Department (HOSPITAL_BASED_OUTPATIENT_CLINIC_OR_DEPARTMENT_OTHER): Payer: MEDICAID

## 2024-05-06 ENCOUNTER — Other Ambulatory Visit: Payer: Self-pay

## 2024-05-06 ENCOUNTER — Encounter (HOSPITAL_BASED_OUTPATIENT_CLINIC_OR_DEPARTMENT_OTHER): Payer: Self-pay | Admitting: Emergency Medicine

## 2024-05-06 DIAGNOSIS — I10 Essential (primary) hypertension: Secondary | ICD-10-CM | POA: Insufficient documentation

## 2024-05-06 DIAGNOSIS — S82892A Other fracture of left lower leg, initial encounter for closed fracture: Secondary | ICD-10-CM | POA: Diagnosis not present

## 2024-05-06 DIAGNOSIS — Z7984 Long term (current) use of oral hypoglycemic drugs: Secondary | ICD-10-CM | POA: Diagnosis not present

## 2024-05-06 DIAGNOSIS — W19XXXA Unspecified fall, initial encounter: Secondary | ICD-10-CM

## 2024-05-06 DIAGNOSIS — E119 Type 2 diabetes mellitus without complications: Secondary | ICD-10-CM | POA: Diagnosis not present

## 2024-05-06 DIAGNOSIS — S99912A Unspecified injury of left ankle, initial encounter: Secondary | ICD-10-CM | POA: Diagnosis present

## 2024-05-06 DIAGNOSIS — Z794 Long term (current) use of insulin: Secondary | ICD-10-CM | POA: Insufficient documentation

## 2024-05-06 DIAGNOSIS — W010XXA Fall on same level from slipping, tripping and stumbling without subsequent striking against object, initial encounter: Secondary | ICD-10-CM | POA: Diagnosis not present

## 2024-05-06 MED ORDER — OXYCODONE HCL 5 MG PO TABS: 2.5000 mg | ORAL_TABLET | Freq: Four times a day (QID) | ORAL | 0 refills | Status: AC | PRN

## 2024-05-06 NOTE — ED Triage Notes (Signed)
 Pt fell on cement while walking uphill; abrasions noted to bil knees; c/o pain to LT ankle (swelling noted) and RT shoulder; denies head injury, no blood thinners

## 2024-05-06 NOTE — ED Provider Notes (Signed)
 [place] [facility] Provider Note   CSN: [id number] Arrival date & time: 05/06/24  1555     Patient presents with: [place]   Anorah Trias is a 58 y.o. female.    Fall   58 year old female presents emergency department after a fall.  States that she was walking on the concrete uphill when she tripped falling forward.  States that she was walking too fast.  States that she landed on her knees and rolled her left ankle inward.  Denies trauma to head, LOC, blood thinner use.  Did state that she recently had a right shoulder rotator cuff repair and wants to make sure her shoulder is okay.  Denying any pain in the shoulder.  Denies any chest, abdominal pain, back/neck pain.  Has been able to walk since the injury with pain in her left ankle.  Past medical history significant for diabetes mellitus, hypertension, bipolar 1 disorder, hyperlipidemia  Prior to Admission medications   Medication Sig Start Date End Date Taking? Authorizing Provider  oxyCODONE  (ROXICODONE ) 5 MG immediate release tablet Take 0.5 tablets (2.5 mg total) by mouth every 6 (six) hours as needed for severe pain (pain score 7-10). 05/06/24  Yes Silver Fell A, PA  insulin glargine (LANTUS) 100 UNIT/ML injection Inject 35 Units into the skin 2 (two) times daily.    [provider]  metFORMIN (GLUCOPHAGE) 1000 MG tablet Take 1,000 mg by mouth 2 (two) times daily with a meal.    [provider]  Paliperidone (INVEGA PO) Take by mouth.    [provider]  polyethylene glycol powder (MIRALAX) powder Take 1 Container by mouth once.    [provider]  traZODone (DESYREL) 100 MG tablet Take 100 mg by mouth at bedtime.    [provider]    Allergies: Codeine and Ibuprofen     Review of Systems  All other systems reviewed and are negative.   Updated Vital Signs BP 116/73   Pulse 98   Temp 98.9 F (37.2 C)   Resp 20   Ht 5' 2 (1.575 m)    Wt 89.4 kg   SpO2 97%   BMI 36.03 kg/m   Physical Exam Vitals and nursing note reviewed.  Constitutional:      General: She is not in acute distress.    Appearance: She is well-developed.  HENT:     Head: Normocephalic and atraumatic.  Eyes:     Conjunctiva/sclera: Conjunctivae normal.  Cardiovascular:     Rate and Rhythm: Normal rate and regular rhythm.     Heart sounds: No murmur heard. Pulmonary:     Effort: Pulmonary effort is normal. No respiratory distress.     Breath sounds: Normal breath sounds.  Abdominal:     Palpations: Abdomen is soft.     Tenderness: There is no abdominal tenderness.  Musculoskeletal:        General: No swelling.     Cervical back: Neck supple.     Comments: No midline tenderness cervical, thoracic, lumbar spine without step-off or deformity.  No chest wall tenderness.  Patient with full range of motion of bilateral upper extremities without overlying bony tenderness.  Patient with superficial abrasion anterior bilateral knee.  Mild right sided patellar tenderness but with full range of motion of bilateral knee.  No obvious bony tenderness of left knee.  Patient with swelling tenderness lateral malleolus of left ankle.  Otherwise, no reproducible tenderness bilateral lower extremities.  Pedal and posterior tibial  pulses 2+ bilaterally.  Able to ambulate in the room without assistance.  Skin:    General: Skin is warm and dry.     Capillary Refill: Capillary refill takes less than 2 seconds.  Neurological:     Mental Status: She is alert.  Psychiatric:        Mood and Affect: Mood normal.     (all labs ordered are listed, but only abnormal results are displayed) Labs Reviewed - No data to display  EKG: None  Radiology: DG Knee Complete 4 Views Right Result Date: 05/06/2024 CLINICAL DATA:  Fall, bilateral knee pain. EXAM: RIGHT KNEE - COMPLETE 4+ VIEW COMPARISON:  None Available. FINDINGS: Slight joint space narrowing in the medial compartment  with early spurring. No joint effusion. No acute bony abnormality. Specifically, no fracture, subluxation, or dislocation. IMPRESSION: Early osteoarthritis in the medial compartment. No acute bony abnormality. Electronically Signed   By: Franky Crease M.D.   On: 05/06/2024 17:41   DG Knee Complete 4 Views Left Result Date: 05/06/2024 CLINICAL DATA:  Fall, bilateral knee pain EXAM: LEFT KNEE - COMPLETE 4+ VIEW COMPARISON:  None Available. FINDINGS: No evidence of fracture, dislocation, or joint effusion. No evidence of arthropathy or other focal bone abnormality. Soft tissues are unremarkable. IMPRESSION: Negative. Electronically Signed   By: Franky Crease M.D.   On: 05/06/2024 17:40   DG Shoulder Right Result Date: 05/06/2024 CLINICAL DATA:  Fall, shoulder pain EXAM: RIGHT SHOULDER - 2+ VIEW COMPARISON:  None Available. FINDINGS: There is no evidence of fracture or dislocation. There is no evidence of arthropathy or other focal bone abnormality. Soft tissues are unremarkable. IMPRESSION: Negative. Electronically Signed   By: Franky Crease M.D.   On: 05/06/2024 17:40   DG Ankle Complete Left Result Date: 05/06/2024 CLINICAL DATA:  Fall, left ankle pain and swelling. EXAM: LEFT ANKLE COMPLETE - 3+ VIEW COMPARISON:  None Available. FINDINGS: Marked lateral soft tissue swelling. Well corticated bone fragments adjacent to the medial and lateral malleoli likely reflect old injuries. Lucency at the tip of the lateral malleolus may reflect an acute avulsion fracture on top of chronic injury. Joint spaces maintained. No subluxation or dislocation. IMPRESSION: Evidence of chronic/old bimalleolar fractures. There appears to be a superimposed acute avulsion fracture off the tip of the lateral malleolus with overlying soft tissue swelling. Electronically Signed   By: Franky Crease M.D.   On: 05/06/2024 17:39     Procedures   Medications Ordered in the ED - No data to display                                   Medical Decision Making Amount and/or Complexity of Data Reviewed Radiology: ordered.  Risk Prescription drug management.   This patient presents to the ED for concern of Fall, this involves an extensive number of treatment options, and is a complaint that carries with it a high risk of complications and morbidity.  The differential diagnosis includes fracture, strain/sprain, dislocation, neurovascular compromise, other   Co morbidities that complicate the patient evaluation  See HPI   Additional history obtained:  Additional history obtained from EMR External records from outside source obtained and reviewed including hospital records   Lab Tests:  N/a   Imaging Studies ordered:  I ordered imaging studies including right shoulder x-ray, bilateral knee x-ray, left ankle x-ray I independently visualized and interpreted imaging which showed  Right shoulder x-ray: No  acute osseous abnormality. Left knee x-ray: No acute osseous abnormality Right knee: No acute osseous abnormality.  Mild medial joint OA Left ankle x-ray: Old bimalleolar fracture left ankle with acute avulsion fracture tip of left bilateral malleolus I agree with the radiologist interpretation  Cardiac Monitoring: / EKG:  N/a   Consultations Obtained:  N/a   Problem List / ED Course / Critical interventions / Medication management  Fall, left ankle fracture Reevaluation of the patient showed that the patient stayed the same I have reviewed the patients home medicines and have made adjustments as needed   Social Determinants of Health:  Denies tobacco, licit drug use.   Test / Admission - Considered:  Fall, left ankle fracture Vitals signs within normal range and stable throughout visit. Imaging studies significant for: See above 58 year old female presents emergency department after a fall.  States that she was walking on the concrete uphill when she tripped falling forward.  States that  she was walking too fast.  States that she landed on her knees and rolled her left ankle inward.  Denies trauma to head, LOC, blood thinner use.  Did state that she recently had a right shoulder rotator cuff repair and wants to make sure her shoulder is okay.  Denying any pain in the shoulder.  Denies any chest, abdominal pain, back/neck pain.  Has been able to walk since the injury with pain in her left ankle. On exam, superficial abrasion bilateral anterior knee.  Mild right sided patellar tenderness but able to range right knee fully.  Tender palpation left lateral malleolus with soft tissue swelling.  Otherwise, no reproducible tenderness or appreciable traumatic injury.  X-rays obtained reassuring besides acute tip of left lateral malleolus fracture with chronic/old appearing bimalleolar fracture.  Patient placed in cam walker boot, crutches.  Will recommend symptomatic therapy and follow-up with orthopedics.  She already has established care through [facility].  Treatment plan discussed with patient she acknowledged understanding was agreeable.  Patient well-appearing, afebrile in no acute distress. Worrisome signs and symptoms were discussed with the patient, and the patient acknowledged understanding to return to the ED if noticed. Patient was stable upon discharge.       Final diagnoses:  None          Silver Wonda LABOR, PA 05/06/24 [id number]    Lenor Hollering, MD 05/06/24 [phone]

## 2024-05-06 NOTE — Discharge Instructions (Addendum)
 As discussed, x-rays of your knee and right shoulder appeared normal.  No obvious fracture or dislocation.  X-ray of your left ankle showed a new small fracture on the outside.  Wear boot and use crutches to help aid walking around until you follow-up with orthopedics.  X-ray did show old fracture of your left ankle.  Call your orthopedic to schedule an appointment for reevaluation.  Will send in medicine to use as needed for pain but recommend use of Tylenol  for baseline pain.
